# Patient Record
Sex: Female | Born: 1980 | Race: Black or African American | Hispanic: No | Marital: Single | Smoking: Current every day smoker
Health system: Southern US, Community
[De-identification: ages and names within clinical notes are randomized; demographics above are authoritative.]

## PROBLEM LIST (undated history)

## (undated) DIAGNOSIS — K8020 Calculus of gallbladder without cholecystitis without obstruction: Secondary | ICD-10-CM

---

## 2005-04-08 ENCOUNTER — Emergency Department: Payer: Self-pay | Admitting: General Practice

## 2005-08-23 ENCOUNTER — Emergency Department: Payer: Self-pay | Admitting: Unknown Physician Specialty

## 2006-05-26 ENCOUNTER — Emergency Department: Payer: Self-pay | Admitting: Emergency Medicine

## 2006-11-17 ENCOUNTER — Emergency Department: Payer: Self-pay | Admitting: Emergency Medicine

## 2008-08-21 ENCOUNTER — Emergency Department: Payer: Self-pay | Admitting: Emergency Medicine

## 2009-06-27 ENCOUNTER — Emergency Department: Payer: Self-pay | Admitting: Emergency Medicine

## 2009-09-14 ENCOUNTER — Encounter: Payer: Self-pay | Admitting: Maternal & Fetal Medicine

## 2009-09-15 ENCOUNTER — Emergency Department: Payer: Self-pay | Admitting: Unknown Physician Specialty

## 2009-11-12 ENCOUNTER — Encounter: Payer: Self-pay | Admitting: Obstetrics and Gynecology

## 2009-12-04 ENCOUNTER — Ambulatory Visit: Payer: Self-pay | Admitting: Advanced Practice Midwife

## 2009-12-07 ENCOUNTER — Encounter: Payer: Self-pay | Admitting: Maternal & Fetal Medicine

## 2009-12-18 ENCOUNTER — Ambulatory Visit: Payer: Self-pay | Admitting: Advanced Practice Midwife

## 2010-01-04 ENCOUNTER — Encounter: Payer: Self-pay | Admitting: Obstetrics & Gynecology

## 2010-01-07 ENCOUNTER — Observation Stay: Payer: Self-pay

## 2010-01-11 ENCOUNTER — Observation Stay: Payer: Self-pay | Admitting: Obstetrics and Gynecology

## 2010-01-13 ENCOUNTER — Observation Stay: Payer: Self-pay

## 2010-01-14 ENCOUNTER — Encounter: Payer: Self-pay | Admitting: Obstetrics & Gynecology

## 2010-01-18 ENCOUNTER — Observation Stay: Payer: Self-pay | Admitting: Obstetrics & Gynecology

## 2010-01-21 ENCOUNTER — Encounter: Payer: Self-pay | Admitting: Obstetrics and Gynecology

## 2010-01-21 ENCOUNTER — Observation Stay: Payer: Self-pay

## 2010-01-25 ENCOUNTER — Observation Stay: Payer: Self-pay | Admitting: Unknown Physician Specialty

## 2010-01-28 ENCOUNTER — Observation Stay: Payer: Self-pay

## 2010-01-28 ENCOUNTER — Encounter: Payer: Self-pay | Admitting: Obstetrics and Gynecology

## 2010-02-01 ENCOUNTER — Observation Stay: Payer: Self-pay

## 2010-02-04 ENCOUNTER — Observation Stay: Payer: Self-pay

## 2010-02-04 ENCOUNTER — Encounter: Payer: Self-pay | Admitting: Obstetrics and Gynecology

## 2010-02-05 ENCOUNTER — Inpatient Hospital Stay: Payer: Self-pay | Admitting: Unknown Physician Specialty

## 2012-01-27 ENCOUNTER — Emergency Department: Payer: Self-pay | Admitting: *Deleted

## 2012-01-27 LAB — CBC
HCT: 37.1 %
HGB: 12.2 g/dL
MCH: 30.1 pg
MCHC: 33 g/dL
MCV: 91 fL
Platelet: 337 10*3/uL
RBC: 4.06 X10 6/mm 3
RDW: 14.4 %
WBC: 14.7 10*3/uL — ABNORMAL HIGH

## 2012-01-27 LAB — COMPREHENSIVE METABOLIC PANEL
Albumin: 3.5 g/dL (ref 3.4–5.0)
Alkaline Phosphatase: 94 U/L (ref 50–136)
BUN: 8 mg/dL (ref 7–18)
Bilirubin,Total: 0.2 mg/dL (ref 0.2–1.0)
Calcium, Total: 8.8 mg/dL (ref 8.5–10.1)
Chloride: 107 mmol/L (ref 98–107)
Creatinine: 0.68 mg/dL (ref 0.60–1.30)
EGFR (African American): >60
Glucose: 99 mg/dL (ref 65–99)
Potassium: 3.2 mmol/L — ABNORMAL LOW (ref 3.5–5.1)
SGPT (ALT): 22 U/L (ref 12–78)
Total Protein: 7.6 g/dL (ref 6.4–8.2)

## 2012-01-27 LAB — HCG, QUANTITATIVE, PREGNANCY: Beta Hcg, Quant.: 39642 m[IU]/mL — ABNORMAL HIGH

## 2012-01-27 LAB — URINALYSIS, COMPLETE
Bilirubin,UR: NEGATIVE
Blood: NEGATIVE
Glucose,UR: NEGATIVE mg/dL
Ketone: NEGATIVE
Nitrite: NEGATIVE
Ph: 5
Protein: NEGATIVE
RBC,UR: 6 /HPF
Specific Gravity: 1.026
Squamous Epithelial: 13
WBC UR: 27 /HPF

## 2012-01-27 LAB — WET PREP, GENITAL

## 2012-01-27 LAB — PREGNANCY, URINE: Pregnancy Test, Urine: POSITIVE m[IU]/mL

## 2012-03-01 ENCOUNTER — Encounter: Payer: Self-pay | Admitting: Obstetrics and Gynecology

## 2012-04-09 ENCOUNTER — Encounter: Payer: Self-pay | Admitting: Obstetrics and Gynecology

## 2012-06-18 ENCOUNTER — Encounter: Payer: Self-pay | Admitting: Maternal & Fetal Medicine

## 2012-07-16 ENCOUNTER — Encounter: Payer: Self-pay | Admitting: Obstetrics and Gynecology

## 2012-08-13 ENCOUNTER — Encounter: Payer: Self-pay | Admitting: Maternal and Fetal Medicine

## 2012-08-20 ENCOUNTER — Observation Stay: Payer: Self-pay | Admitting: Obstetrics and Gynecology

## 2012-08-27 ENCOUNTER — Encounter: Payer: Self-pay | Admitting: Maternal & Fetal Medicine

## 2012-08-31 ENCOUNTER — Inpatient Hospital Stay: Payer: Self-pay | Admitting: Obstetrics and Gynecology

## 2012-08-31 LAB — CBC WITH DIFFERENTIAL/PLATELET
Basophil #: 0 10*3/uL (ref 0.0–0.1)
Eosinophil #: 0.1 10*3/uL (ref 0.0–0.7)
HCT: 33.3 % — ABNORMAL LOW (ref 35.0–47.0)
HGB: 11.1 g/dL — ABNORMAL LOW (ref 12.0–16.0)
Lymphocyte #: 3.4 10*3/uL (ref 1.0–3.6)
Lymphocyte %: 26.5 %
MCH: 30.4 pg (ref 26.0–34.0)
MCHC: 33.5 g/dL (ref 32.0–36.0)
MCV: 91 fL (ref 80–100)
Neutrophil #: 8.4 10*3/uL — ABNORMAL HIGH (ref 1.4–6.5)
Neutrophil %: 64.8 %
Platelet: 369 10*3/uL (ref 150–440)
RBC: 3.67 10*6/uL — ABNORMAL LOW (ref 3.80–5.20)
WBC: 12.9 10*3/uL — ABNORMAL HIGH (ref 3.6–11.0)

## 2013-10-01 ENCOUNTER — Emergency Department: Payer: Self-pay | Admitting: Emergency Medicine

## 2014-07-10 IMAGING — US US OB FOLLOW-UP - NRPT [facility]
1 series · 14 of 28 positions shown · non-contrast
Comparison: none

[Series 1: us ob follow-up - nrpt [facility] · 0.33mm/px · 14 of 64 slices shown]
[im 3/64]
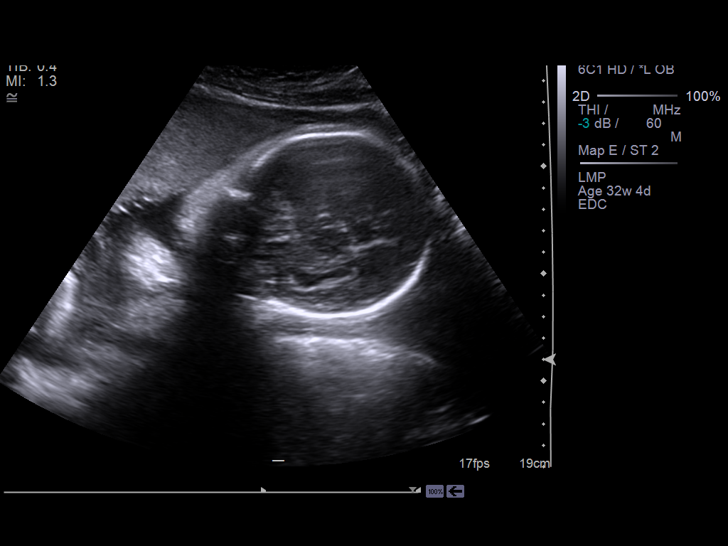
[im 8/64]
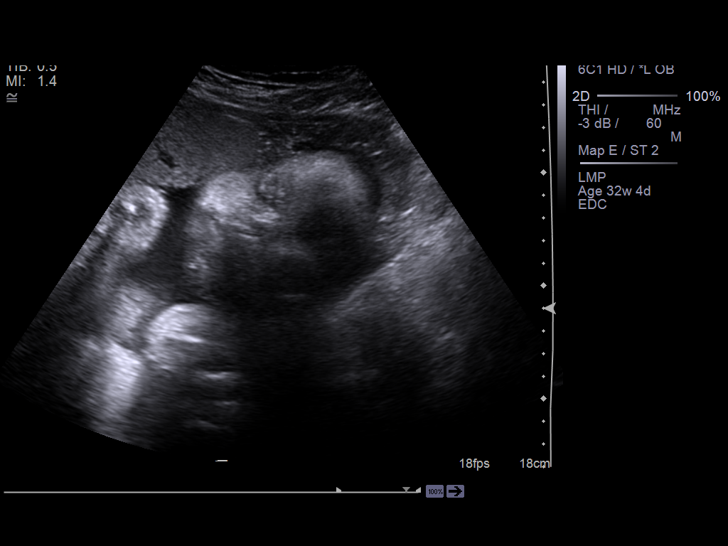
[im 12/64]
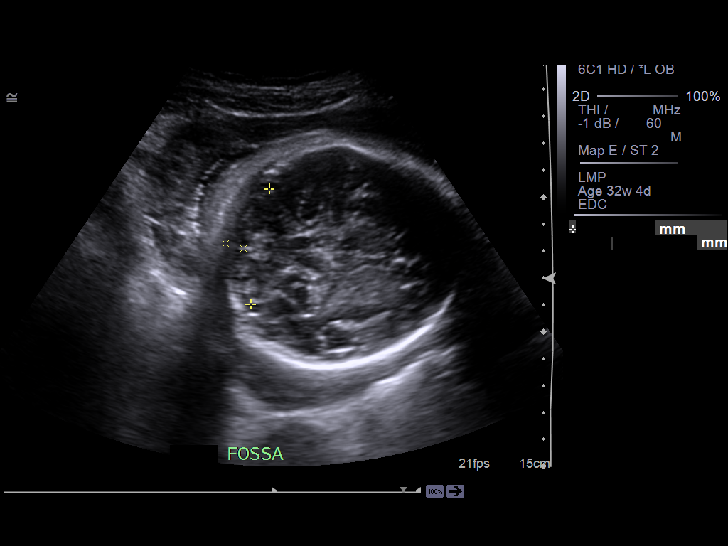
[im 17/64]
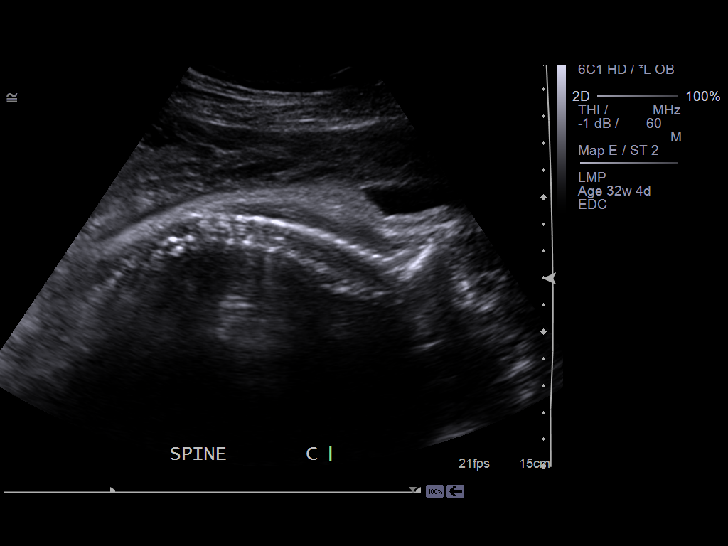
[im 22/64]
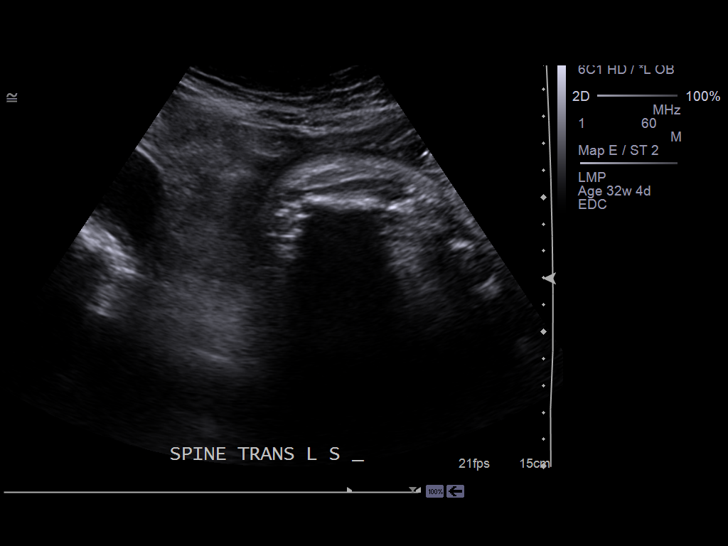
[im 26/64]
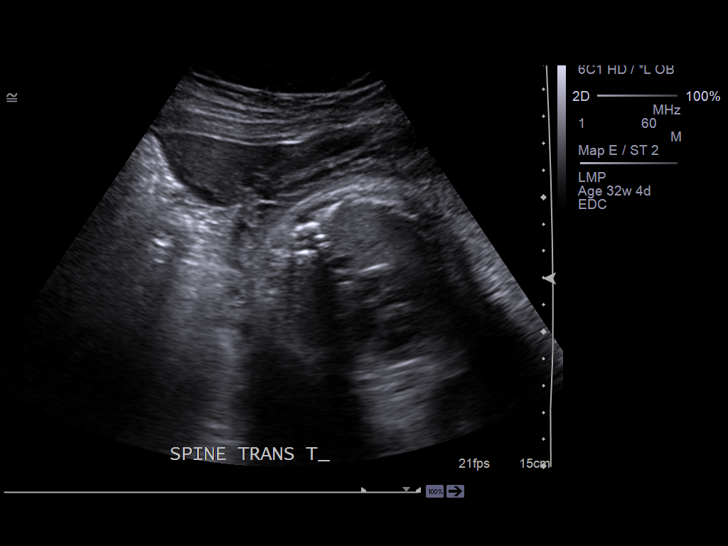
[im 31/64]
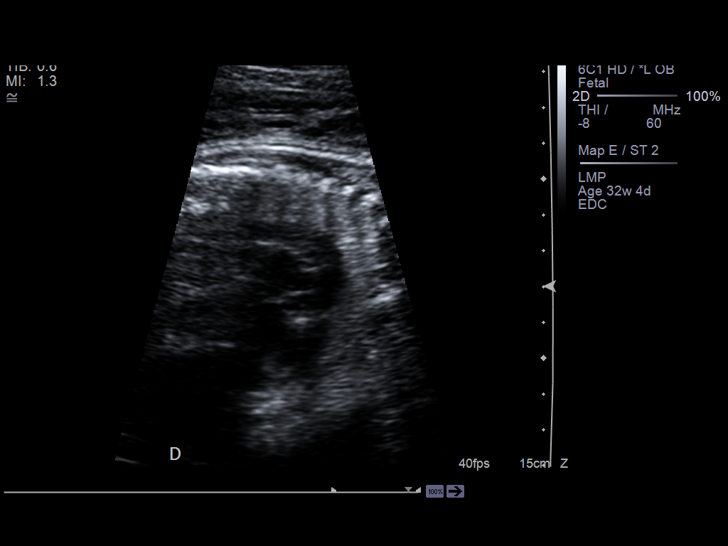
[im 36/64]
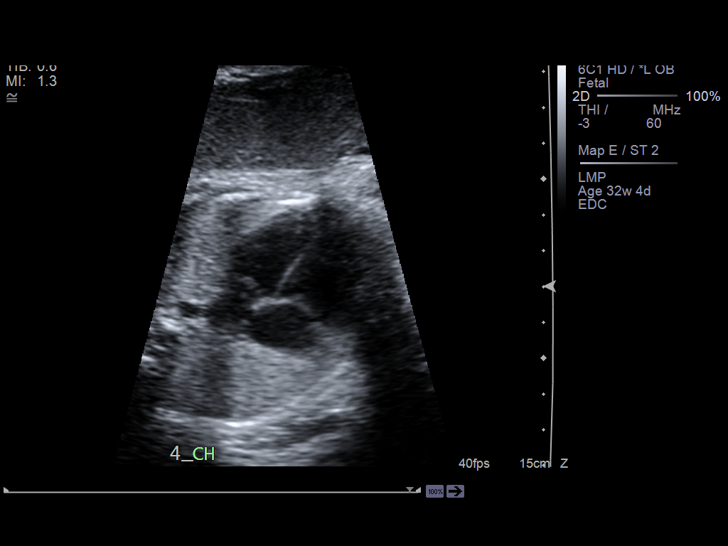
[im 40/64]
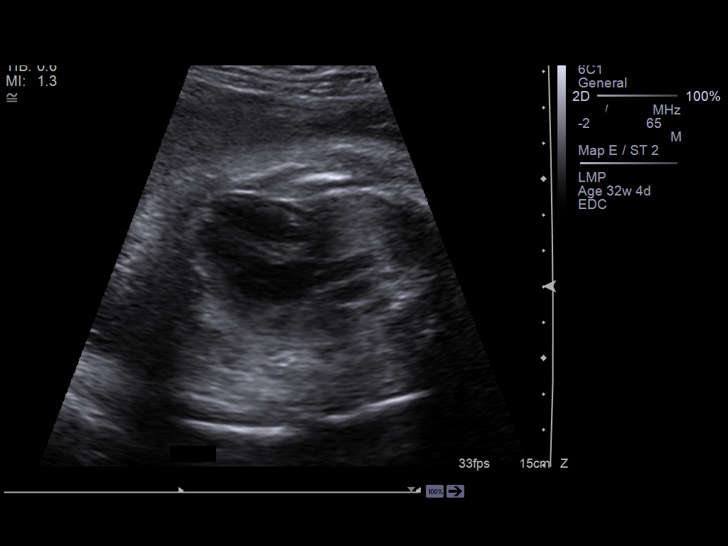
[im 45/64]
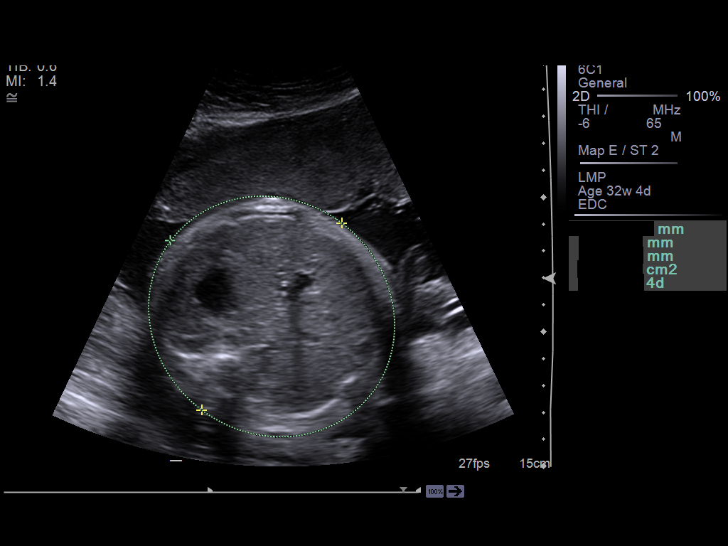
[im 50/64]
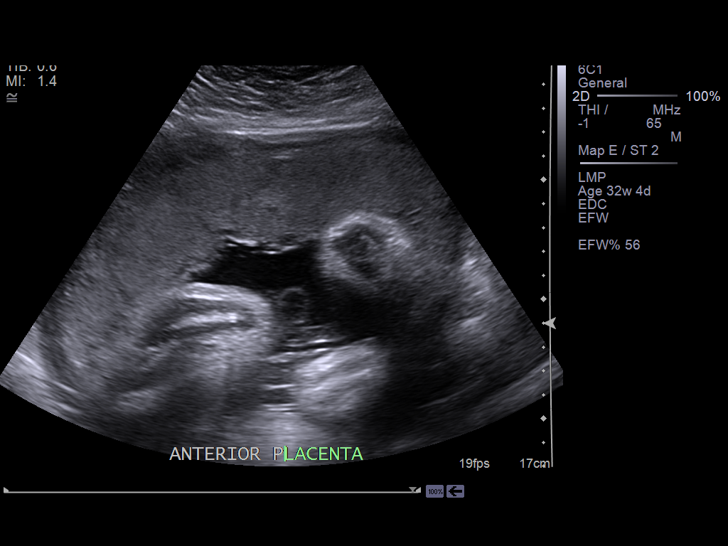
[im 54/64]
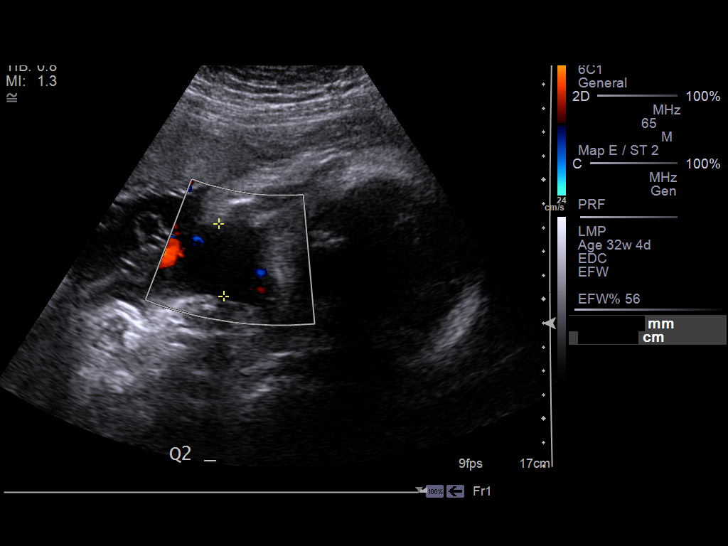
[im 59/64]
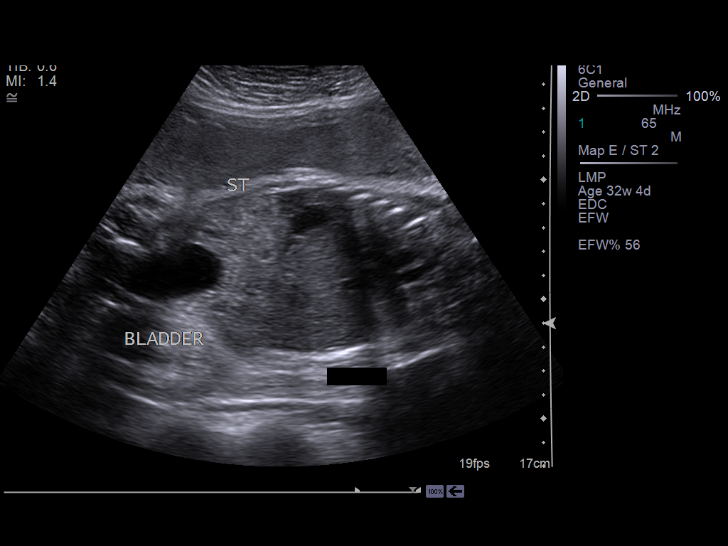
[im 64/64]
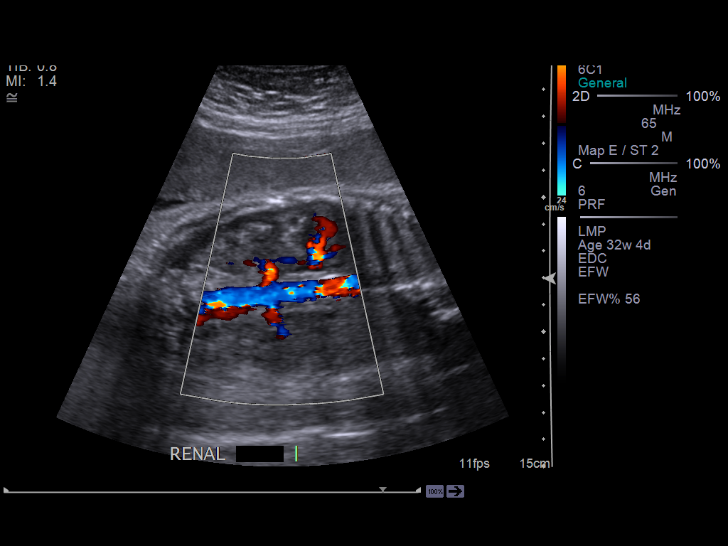

[14 of 28 positions shown; findings below may reference images not displayed]

IMAGES IMPORTED FROM THE SYNGO WORKFLOW SYSTEM
NO DICTATION FOR STUDY

## 2014-08-21 IMAGING — US ULTRAOUND OB LIMITED - NRPT [facility]
1 series · 13 of 13 positions shown · non-contrast
Comparison: none

[Series 1: ultraound ob limited - nrpt [facility] · 0.30mm/px · 13 of 13 slices shown]
[im 1/13]
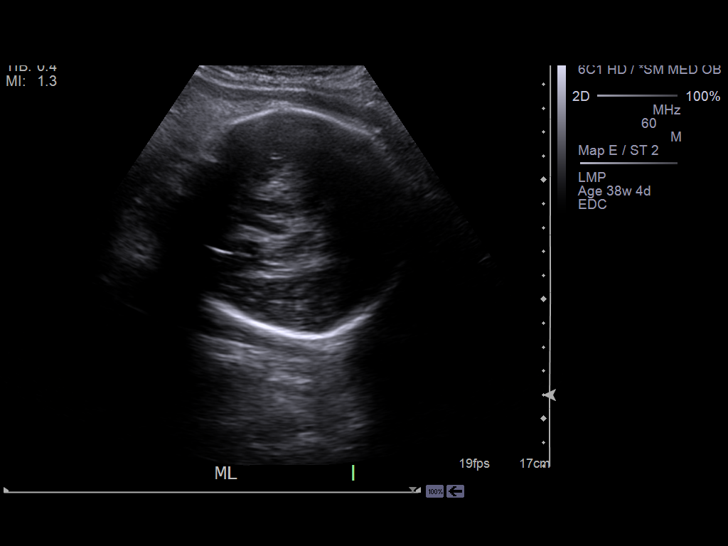
[im 2/13]
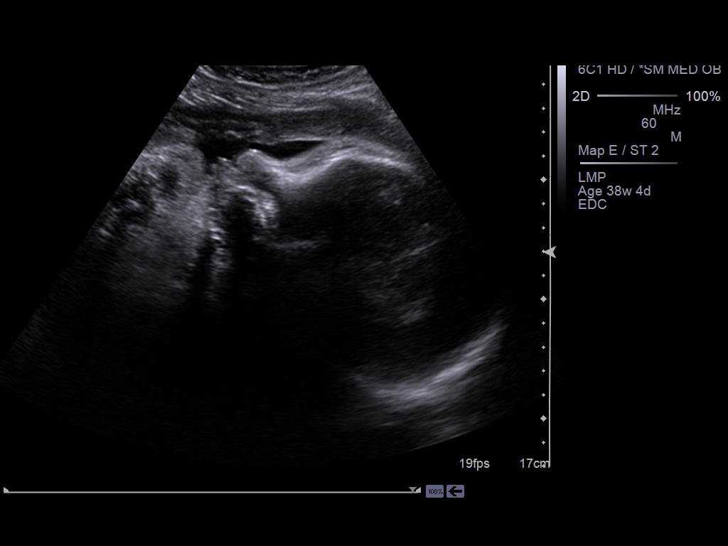
[im 3/13]
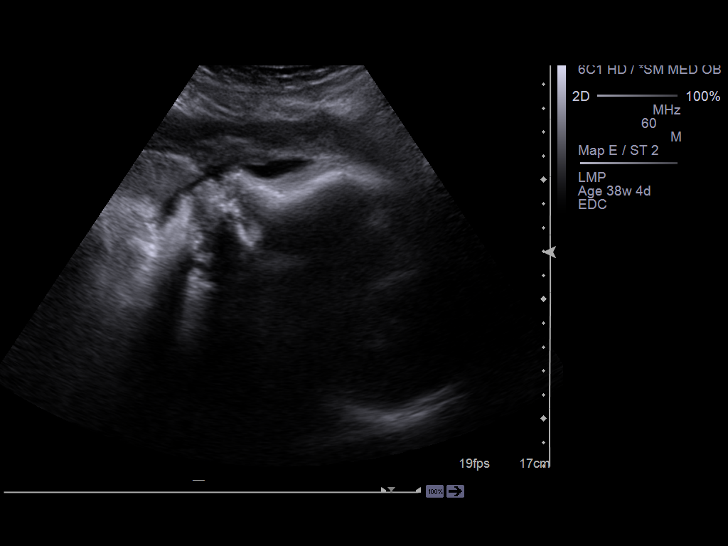
[im 4/13]
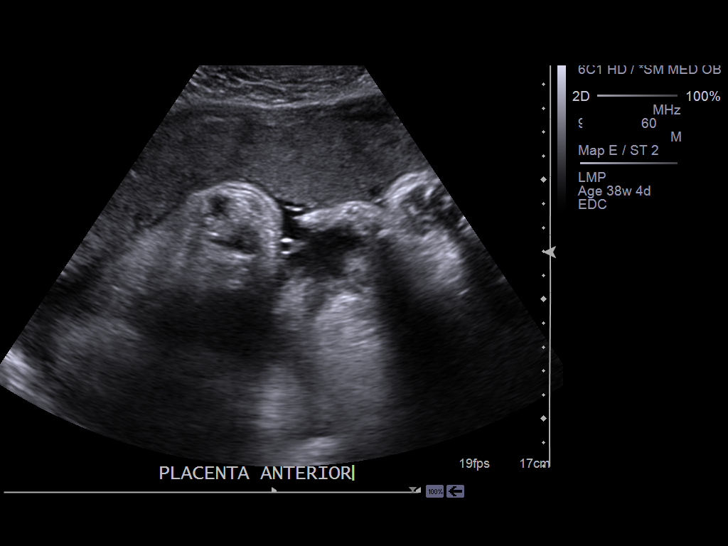
[im 5/13]
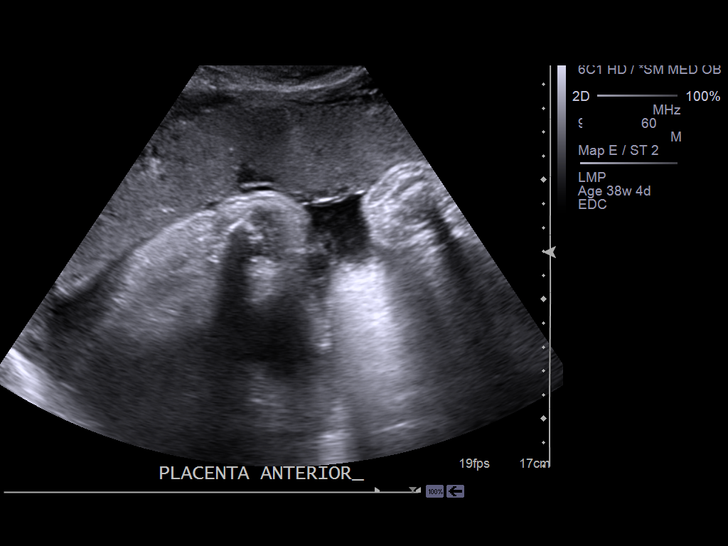
[im 6/13]
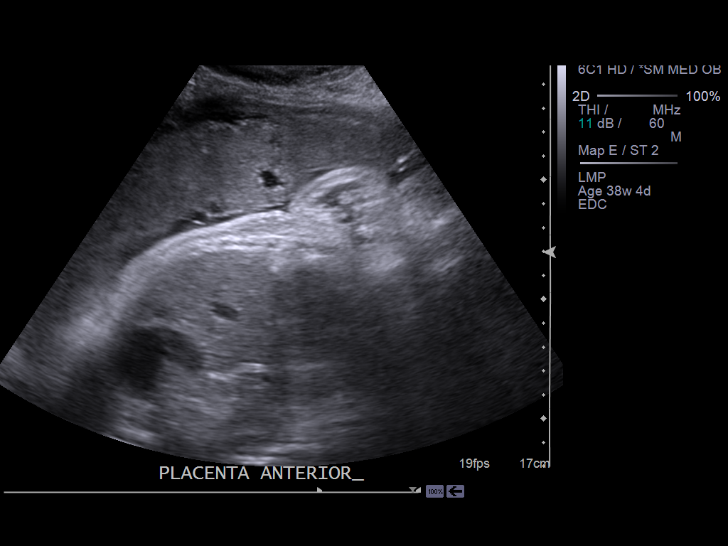
[im 7/13]
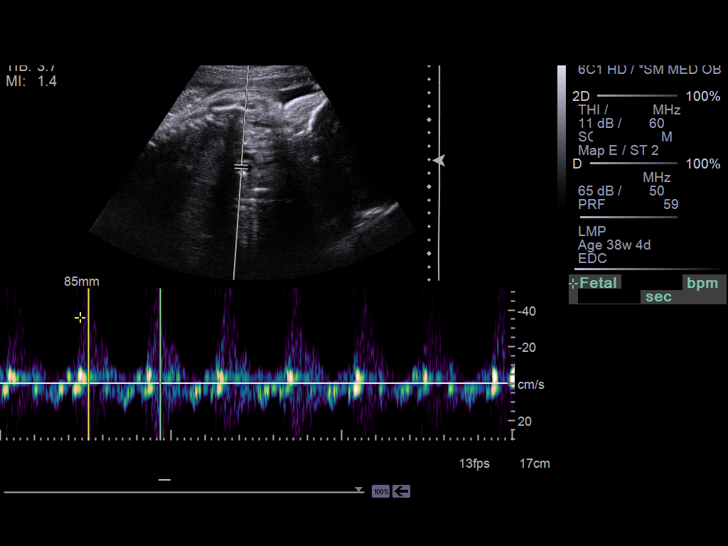
[im 8/13]
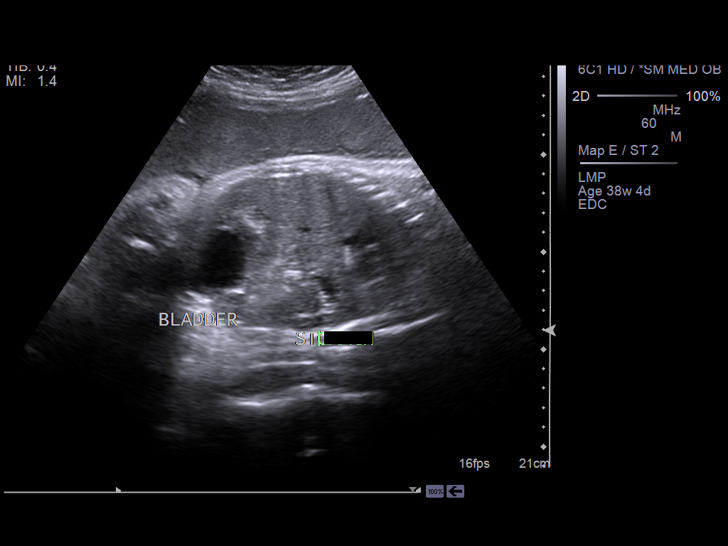
[im 9/13]
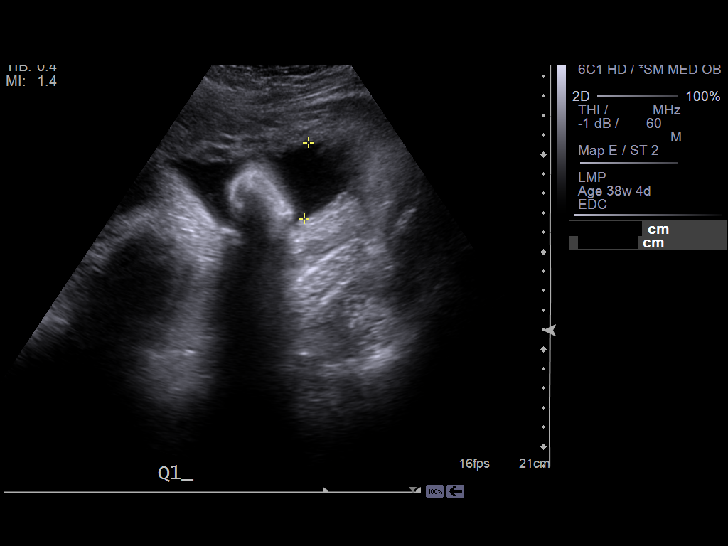
[im 10/13]
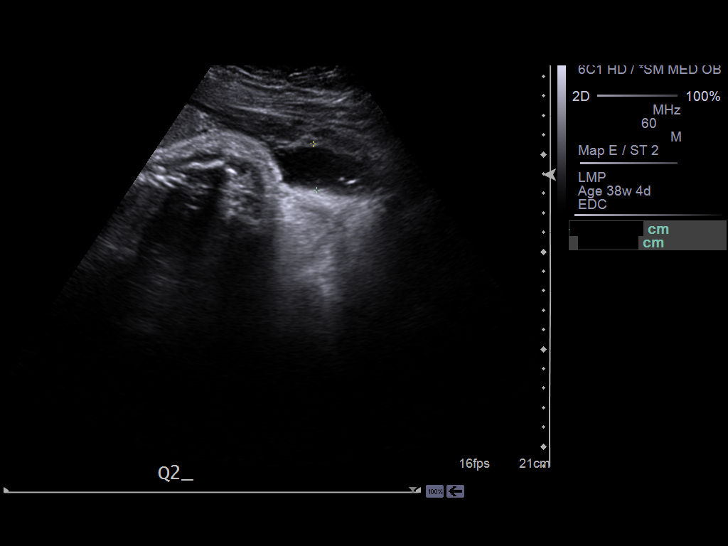
[im 11/13]
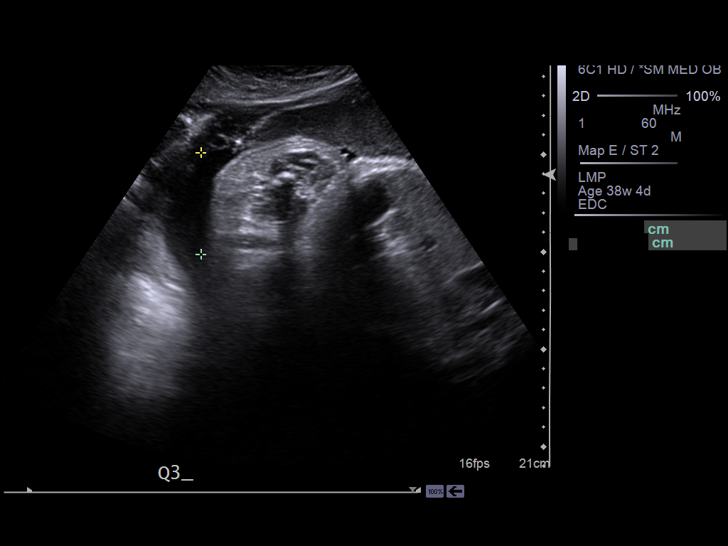
[im 12/13]
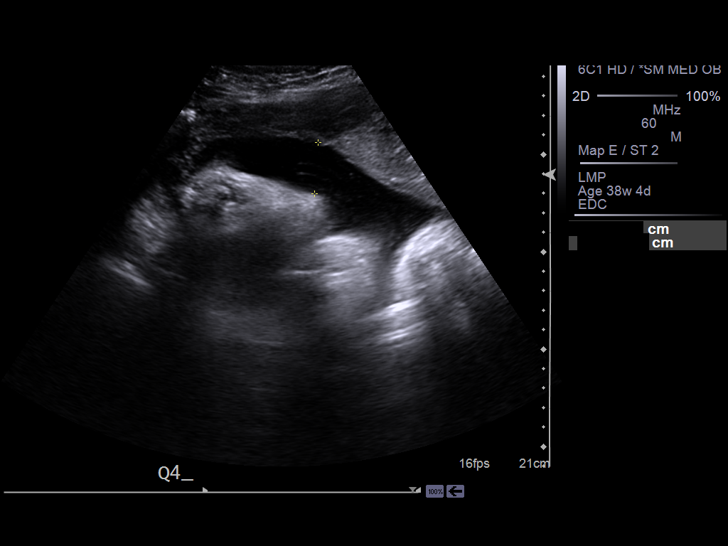
[im 13/13]
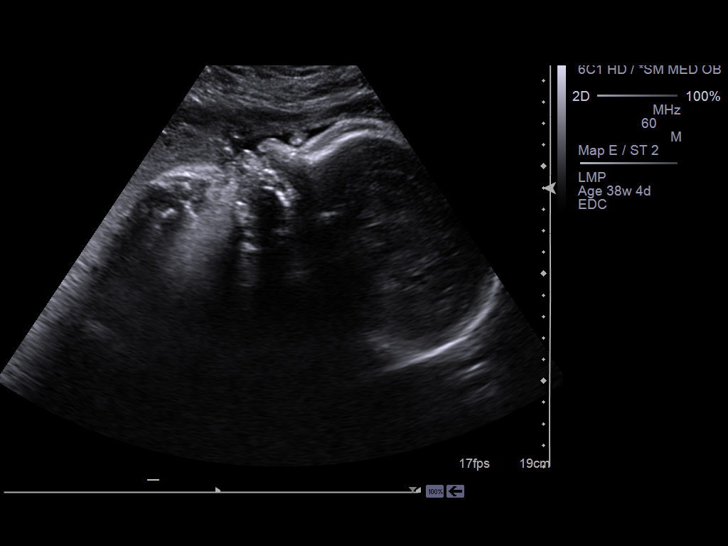

[13 of 13 positions shown; findings below may reference images not displayed]

IMAGES IMPORTED FROM THE SYNGO WORKFLOW SYSTEM
NO DICTATION FOR STUDY

## 2014-10-07 NOTE — Consult Note (Signed)
Referral Information:   Reason for Referral History of gestational diabetes and borderline hypertension, currently pregnant    Referring Physician [facility] Department    Prenatal Hx Brianna Murphy is a 34 year-old G2 P1001 at 5 0/7 weeks ([facility] 09/08/12) who presents for recommendations in her current pregnancy.  We saw Brianna Murphy in her prior pregnancy for borderline hypertension. Her BP's remained in the normal range throughout the entire pregnancy. She never required treatment and never developed preeclampsia.  She states that her blood pressure increased once she was discharged postpartum and was on medication for approximately 6 months but then discontinued this as her pressures were normal. She is unsure of the name of the medication.  Also during her prior pregnnacy she developed gestational diabetes. She states that this required low-dose glyburide (2.5 mg every evening). She was induced at 39 weeks and had an uncomplicated spontaneous vaginal delivery of a 6 pound 14 ounce female.  She states that she was checked for type II diabetes after delivery and this workup was negative.   Currently she is without complaints.    Past Obstetrical Hx G2 P1001 -2011: spontaneous vaginal delivery of 6 pound 14 ounce female. Labor induction at 39 weeks for GDM on low-dose glyburide. No complications. No preeclampsia.   Home Medications: Medication Instructions Status  Prenatal Multivitamins oral tablet 1 tab(s) orally once a day Active   Allergies:   Latex: Rash  Vital Signs/Notes:  Nursing Vital Signs: **Vital Signs.:   12-Sep-13 10:13   Pulse Pulse 87   Systolic BP Systolic BP 133   Diastolic BP (mmHg) Diastolic BP (mmHg) 76     Additional Lab/Radiology Notes Prenatal labs ([facility], 01/27/12): O positive, antibody screen negative, Hep B neg, Sickle Screen neg, RPR non-reactive, HIV pending, GC/Chlamydia neg, Hct 37.8, Platlet count 347, Hemoglocin A1C 6.0%, AST 10, Uric acid 3.9, Tox  screen + THC. Early glucola 133  BP's at PN visits: 136/90, 122/84, 118/72, 140/84, 130/78 Urine protein:  Trace   Impression/Recommendations:   Impression 34 year-old G2 P1001 at 13 weeks with borderline elevated blood pressures and history of gestational diabetes that required therapy in a prior pregnnacy.  Her hemoglobin A1C is 6.0% and her early glucola is 133. She is also obese.    Recommendations 1. We discussed risks of obesity in pregnancy to include hypertension, diabetes, cesarean delivery and wound complications.  She has been referred to [facility] by the [facility].  A referral to [facility] will likely be beneficial.  We also discussed appropriate weight gain and need for exercise in pregnancy.  2. Borderline hypertension.  Brianna Murphy's blood pressures at her prenatal visits have been borderline elevated. Currently she does not require therapy but she is a risk for worsening hypertension and complications associated with hypertension in pregnancy.  Should her blood pressure increase to greater than 140/90 on two different measurements, then we recommend initiating labetalol (100 mg every 12 hours) and then increasing as needed to keep her blood pressure less than 140/90 (max dose is 2400 mg daily).  If she requires initation of antihypertensive therapy, we would be happy to see her back again to make further pregnancy recommendations. A baseline 24 hour urine for protein and creatinine would be beneficial as well as an EKG.  3. History of GDM requiring therapy.  Her hemoglobin A1C is borderline (6.0%) as is her early glucola (130). It is not clear if she has type II diabetes but she is certainly at risk (obese, history of GDM requiring  therapy).  I feel that it would be beneficial for Brianna Murphy to be referred to [facility] to review appropriate diet.  Furthermore, it may be beneficial for her to check her Fasting and two hour postprandial values for the next 2-3 weeks. If these are normal, then repeat  her glucola at approximately 28 weeks. If these are abnormal, we would be happy to see her back, to follow her sugars and make recommendations for potential medical therapy. We discussed complications associated with poorly controlled diabetes.     Comments ...continued from above....  4. Further recommendations: -Offer aneuploidy and ONTD screening -She is scheduled for a detailed anatomy ultrasound with us at 18 weeks -Check growth ultrasound at 28 weeks -Should she develop hypertension that requires therapy or gestational diabetes, please send her back so we can make further pregnnacy recommendations. -We also discussed risks of smoking and marijuana use in pregnancy. She states she understands these risks. She is down to 1 cigaretter per day and is no longer using marijuana.     Total Time Spent with Patient 45 minutes    >50% of visit spent in couseling/coordination of care yes    Office Use Only [address]  Level 3 (40min) NEW office consult detailed   Coding Description: MATERNAL CONDITIONS/HISTORY INDICATION(S).   OTHER: history of GDM. Borderline hypertension.  Electronic Signatures: Konstantin Lehnen, Brianna Murphy (MD)  (Signed 12-Sep-13 11:18)  Authored: Referral, Home Medications, Allergies, Vital Signs/Notes, Exam, Lab/Radiology Notes, Impression, Other Comments, Billing, Coding Description   Last Updated: 12-Sep-13 11:18 by Brianna Murphy, Brianna Murphy (MD)

## 2014-10-28 NOTE — H&P (Signed)
L&D Evaluation:  History Expanded:  HPI 34 yo G2P1 AA F at 7838 6/7 weeks w spontaneous rupture of membranes at 2315 last night, w steady increase in contractions iver the last couple of hours.  No vaginal bleeding.  Good FM. Prenatal Care at [facility], cHTN but well controlled without medicine this pregnancy.  Risk factors included obesity and smoking history.  Prior Normal Spontaneous Vaginal Delivery, induced for Pregnancy Induced Hypertension and GDM that time.   Gravida 2   Term 1   Blood Type (Maternal) O positive   Group B Strep Results Maternal (Result >5wks must be treated as unknown) negative   Maternal Varicella Immune   Rubella Results (Maternal) immune   Maternal T-Dap Immune   [facility]   Presents with leaking fluid   Patient's Medical History Hypertension   Patient's Surgical History none   Medications Pre Natal Vitamins   Allergies NKDA, Latex   Social History tobacco   Family History Non-Contributory   ROS:  ROS All systems were reviewed.  HEENT, CNS, GI, GU, Respiratory, CV, Renal and Musculoskeletal systems were found to be normal.   Exam:  Vital Signs stable   General no apparent distress   Mental Status clear   Chest clear   Heart normal sinus rhythm, no murmur/gallop/rubs   Abdomen gravid, tender with contractions   Estimated Fetal Weight Average for gestational age   Back no CVAT   Edema no edema   Pelvic no external lesions, 7/90/0   Mebranes Ruptured   Description clear   FHT normal rate with no decels, 130s, reactive   Ucx irregular   Skin dry   Impression:  Impression active labor   Plan:  Plan EFM/NST, monitor contractions and for cervical change, fluids   Electronic Signatures: Letitia LibraHarris, Robert Paul (MD)  (Signed 14-Mar-14 07:55)  Authored: L&D Evaluation   Last Updated: 14-Mar-14 07:55 by Letitia LibraHarris, Robert Paul (MD)

## 2016-05-07 ENCOUNTER — Emergency Department
Admission: EM | Admit: 2016-05-07 | Discharge: 2016-05-07 | Disposition: A | Discharge status: Home / Self Care | Payer: Medicaid Other | Attending: Emergency Medicine | Admitting: Emergency Medicine

## 2016-05-07 ENCOUNTER — Encounter: Payer: Self-pay | Admitting: Emergency Medicine

## 2016-05-07 DIAGNOSIS — Y999 Unspecified external cause status: Secondary | ICD-10-CM | POA: Insufficient documentation

## 2016-05-07 DIAGNOSIS — X58XXXA Exposure to other specified factors, initial encounter: Secondary | ICD-10-CM | POA: Insufficient documentation

## 2016-05-07 DIAGNOSIS — S025XXA Fracture of tooth (traumatic), initial encounter for closed fracture: Secondary | ICD-10-CM | POA: Diagnosis not present

## 2016-05-07 DIAGNOSIS — Y939 Activity, unspecified: Secondary | ICD-10-CM | POA: Insufficient documentation

## 2016-05-07 DIAGNOSIS — Y929 Unspecified place or not applicable: Secondary | ICD-10-CM | POA: Insufficient documentation

## 2016-05-07 DIAGNOSIS — K0889 Other specified disorders of teeth and supporting structures: Secondary | ICD-10-CM

## 2016-05-07 DIAGNOSIS — F17210 Nicotine dependence, cigarettes, uncomplicated: Secondary | ICD-10-CM | POA: Insufficient documentation

## 2016-05-07 MED ORDER — AMOXICILLIN 500 MG PO CAPS: 500.0000 mg | ORAL_CAPSULE | Freq: Three times a day (TID) | ORAL | 0 refills | Status: DC

## 2016-05-07 MED ORDER — IBUPROFEN 800 MG PO TABS: 800.0000 mg | ORAL_TABLET | Freq: Three times a day (TID) | ORAL | 0 refills | Status: DC | PRN

## 2016-05-07 NOTE — ED Notes (Signed)
Pt verbalized understanding of discharge instructions. NAD at this time. 

## 2016-05-07 NOTE — Discharge Instructions (Signed)
Follow-up list of dental clinics provided; °OPTIONS FOR DENTAL FOLLOW UP CARE ° °[place] Department of Health and Human Services - Local Safety Net Dental Clinics °http://www.ncdhhs.gov/dph/oralhealth/services/safetynetclinics.htm °  °[facility] ([phone]) ° °[facility] [place] ([phone]) ° °[facility] ([phone] ext [phone]) ° °[facility] ([phone]) ° °[facility] ([phone]) °This clinic caters to the indigent population and is on a lottery system. °Location: °[facility], [address], [place] °Clinic Hours: °Wednesdays from 6pm - 9pm, patients seen by a lottery system. °For dates, call or go to www.med.[facility].edu/[facility]/patients/Dental-[facility] °Services: °Cleanings, fillings and simple extractions. °Payment Options: °DENTAL WORK IS FREE OF CHARGE. Bring proof of income or support. °Best way to get seen: °Arrive at 5:15 pm - this is a lottery, NOT first come/first serve, so arriving earlier will not increase your chances of being seen. °  °  °[facility] °[phone] °Select option 1 for emergencies °  °Location: °[facility], [address], [place] °Clinic Hours: °No walk-ins accepted - call the day before to schedule an appointment. °Check in times are 9:30 am and 1:30 pm. °Services: °Simple extractions, temporary fillings, pulpectomy/pulp debridement, uncomplicated abscess drainage. °Payment Options: °PAYMENT IS DUE AT THE TIME OF SERVICE.  Fee is usually $100-200, additional surgical procedures (e.g. abscess drainage) may be extra. °Cash, checks, Visa/MasterCard accepted.  Can file Medicaid if patient is covered for dental - patient should call case worker to check. °No discount for [facility] patients. °Best way to get seen: °MUST call the day before and get onto the schedule. Can usually be seen the next 1-2 days. No walk-ins accepted. °  °  °[facility] °[phone] °  °Location: °[facility], [address], [place] °Clinic Hours: °M, W, Th, F 8am or 1:30pm, Tues 9a or 1:30 - first come/first served. °Services: °Simple extractions, temporary fillings, uncomplicated abscess drainage.  You do not need to be an [place] resident. °Payment Options: °PAYMENT IS DUE AT THE TIME OF SERVICE. °Dental insurance, otherwise sliding scale - bring proof of income or support. °Depending on income and treatment needed, cost is usually $50-200. °Best way to get seen: °Arrive early as it is first come/first served. °  °  °[facility] °[phone] °  °Location: °[address] °Clinic Hours: °Mon-Thu 8a-5p °Services: °Most basic dental services including extractions and fillings. °Payment Options: °PAYMENT IS DUE AT THE TIME OF SERVICE. °Sliding scale, up to 50% off - bring proof if income or support. °Medicaid with dental option accepted. °Best way to get seen: °Call to schedule an appointment, can usually be seen within 2 weeks OR they will try to see walk-ins - show up at 8a or 2p (you may have to wait). °  °  °[facility] °[phone] °[place] RESIDENTS ONLY °  °Location: °[facility], [address], [place], [place] [address] °Clinic Hours: By appointment only. °Monday - Thursday 8am-5pm, Friday 8am-12pm °Services: Cleanings, fillings, extractions. °Payment Options: °PAYMENT IS DUE AT THE TIME OF SERVICE. °Cash, Visa or MasterCard. Sliding scale - $30 minimum per service. °Best way to get seen: °Come in to office, complete packet and make an appointment - need proof of income °or support monies for each household member and proof of [place] residence. °Usually takes about a month to get in. °  °  °[facility] °[phone] °  °  Location: [address], [place] Clinic Hours: Walk-in Urgent Care Dental Services  are offered Monday-Friday mornings only. The numbers of emergencies accepted daily is limited to the number of providers available. Maximum 15 - Mondays, Wednesdays & Thursdays Maximum 10 - Tuesdays & Fridays Services: You do not need to be a [facility] resident to be seen for a dental emergency. Emergencies are defined as pain, swelling, abnormal bleeding, or dental trauma. Walkins will receive x-rays if needed. NOTE: Dental cleaning is not an emergency. Payment Options: PAYMENT IS DUE AT THE TIME OF SERVICE. Minimum co-pay is $40.00 for uninsured patients. Minimum co-pay is $3.00 for Medicaid with dental coverage. Dental Insurance is accepted and must be presented at time of visit. Medicare does not cover dental. Forms of payment: Cash, credit card, checks. Best way to get seen: If not previously registered with the clinic, walk-in dental registration begins at 7:15 am and is on a first come/first serve basis. If previously registered with the clinic, call to make an appointment.     [facility] [place] ONLY   Location: [address], [place], [place] Clinic Hours: Mon-Thu 10a-2p Services: Extractions only! Payment Options: FREE (donations accepted) - bring proof of income or support Best way to get seen: Call and schedule an appointment OR come at 8am on the 1st Monday of every month (except for holidays) when it is first come/first served.     [facility] [phone]   Location: [place], [place] Clinic Hours: Friday mornings Services, Payment Options, Best way to get seen: Call for info

## 2016-05-07 NOTE — ED Provider Notes (Signed)
[facility] Emergency Department Provider Note   ____________________________________________   First MD Initiated Contact with Patient 05/07/16 1251     (approximate)  I have reviewed the triage vital signs and the nursing notes.   HISTORY  Chief Complaint Dental Pain    HPI Brianna Murphy is a 35 y.o. female patient complaining of left lower dental pain since yesterday. Patient stated upon his waking this morning she knows some mild swelling. Patient rates the pain as a 10. Patient described a pain as "achy". Patient states she has a cracked tooth in the left lower molar. No palliative measures for this complaint.   No past medical history on file.  There are no active problems to display for this patient.   History reviewed. No pertinent surgical history.  Prior to Admission medications   Medication Sig Start Date End Date Taking? Authorizing Provider  amoxicillin (AMOXIL) 500 MG capsule Take 1 capsule (500 mg total) by mouth 3 (three) times daily. 05/07/16   Joni Reiningonald K Anael Rosch, PA-C  ibuprofen (ADVIL,MOTRIN) 800 MG tablet Take 1 tablet (800 mg total) by mouth every 8 (eight) hours as needed for moderate pain. 05/07/16   Joni Reiningonald K Gilmer Kaminsky, PA-C    Allergies Latex  No family history on file.  Social History Social History  Substance Use Topics  . Smoking status: Current Every Day Smoker    Packs/day: 0.50    Types: Cigarettes  . Smokeless tobacco: Not on file  . Alcohol use No    Review of Systems Constitutional: No fever/chills Eyes: No visual changes. ENT: No sore throat. Dental pain Cardiovascular: Denies chest pain. Respiratory: Denies shortness of breath. Gastrointestinal: No abdominal pain.  No nausea, no vomiting.  No diarrhea.  No constipation. Genitourinary: Negative for dysuria. Musculoskeletal: Negative for back pain. Skin: Negative for rash. Neurological: Negative for headaches, focal weakness or  numbness.    ____________________________________________   PHYSICAL EXAM:  VITAL SIGNS: ED Triage Vitals  Enc Vitals Group     BP 05/07/16 1235 (!) 154/93     Pulse Rate 05/07/16 1235 82     Resp 05/07/16 1235 18     Temp 05/07/16 1235 98.6 F (37 C)     Temp Source 05/07/16 1235 Oral     SpO2 05/07/16 1235 95 %     Weight 05/07/16 1236 256 lb (116.1 kg)     Height 05/07/16 1236 5\' 10"  (1.778 m)     Head Circumference --      Peak Flow --      Pain Score 05/07/16 1236 4     Pain Loc --      Pain Edu? --      Excl. in GC? --    Constitutional: Alert and oriented. Well appearing and in no acute distress. Eyes: Conjunctivae are normal. PERRL. EOMI. Head: Atraumatic. Nose: No congestion/rhinnorhea. Mouth/Throat: Mucous membranes are moist.  Oropharynx non-erythematous.Fractured tooth #19 with mild gingival edema. Neck: No stridor.  No cervical spine tenderness to palpation. Hematological/Lymphatic/Immunilogical: No cervical lymphadenopathy. Cardiovascular: Normal rate, regular rhythm. Grossly normal heart sounds.  Good peripheral circulation. Respiratory: Normal respiratory effort.  No retractions. Lungs CTAB. Gastrointestinal: Soft and nontender. No distention. No abdominal bruits. No CVA tenderness. Musculoskeletal: No lower extremity tenderness nor edema.  No joint effusions. Neurologic:  Normal speech and language. No gross focal neurologic deficits are appreciated. No gait instability. Skin:  Skin is warm, dry and intact. No rash noted. Psychiatric: Mood and affect are normal. Speech  and behavior are normal.  ____________________________________________   LABS (all labs ordered are listed, but only abnormal results are displayed)  Labs Reviewed - No data to display ____________________________________________  EKG   ____________________________________________  RADIOLOGY   ____________________________________________   PROCEDURES  Procedure(s)  performed: None  Procedures  Critical Care performed: No  ____________________________________________   INITIAL IMPRESSION / ASSESSMENT AND PLAN / ED COURSE  Pertinent labs & imaging results that were available during my care of the patient were reviewed by me and considered in my medical decision making (see chart for details).  Dental pain secondary to fractured tooth. Patient given discharge care instructions. Patient given a list of dental clinic for follow-up care. Patient given a prescription for Amoxil and ibuprofen.  Clinical Course      ____________________________________________   FINAL CLINICAL IMPRESSION(S) / ED DIAGNOSES  Final diagnoses:  Closed fracture of tooth, initial encounter  Pain, dental      NEW MEDICATIONS STARTED DURING THIS VISIT:  New Prescriptions   AMOXICILLIN (AMOXIL) 500 MG CAPSULE    Take 1 capsule (500 mg total) by mouth 3 (three) times daily.   IBUPROFEN (ADVIL,MOTRIN) 800 MG TABLET    Take 1 tablet (800 mg total) by mouth every 8 (eight) hours as needed for moderate pain.     Note:  This document was prepared using Dragon voice recognition software and may include unintentional dictation errors.    Joni ReiningRonald K Rohaan Durnil, PA-C 05/07/16 1306    Myrna Blazeravid Matthew Schaevitz, MD 05/07/16 [id number]

## 2016-05-07 NOTE — ED Triage Notes (Signed)
L lower dental pain since yesterday, today notes swelling.

## 2017-09-14 ENCOUNTER — Other Ambulatory Visit: Payer: Self-pay

## 2017-09-14 ENCOUNTER — Encounter: Payer: Self-pay | Admitting: Emergency Medicine

## 2017-09-14 DIAGNOSIS — F17210 Nicotine dependence, cigarettes, uncomplicated: Secondary | ICD-10-CM | POA: Insufficient documentation

## 2017-09-14 DIAGNOSIS — K8020 Calculus of gallbladder without cholecystitis without obstruction: Secondary | ICD-10-CM | POA: Insufficient documentation

## 2017-09-14 DIAGNOSIS — Z91040 Latex allergy status: Secondary | ICD-10-CM | POA: Insufficient documentation

## 2017-09-14 NOTE — ED Triage Notes (Signed)
Patient ambulatory to triage with steady gait, without difficulty or distress noted; pt reports lower abd pain intermittently x 2-3wks with no accomp symptoms; pt is also requesting to have her implanon removed because its overdue to be removed

## 2017-09-15 ENCOUNTER — Emergency Department
Admission: EM | Admit: 2017-09-15 | Discharge: 2017-09-15 | Disposition: A | Discharge status: Home / Self Care | Payer: Self-pay | Attending: Emergency Medicine | Admitting: Emergency Medicine

## 2017-09-15 ENCOUNTER — Emergency Department: Payer: Self-pay

## 2017-09-15 DIAGNOSIS — R52 Pain, unspecified: Secondary | ICD-10-CM

## 2017-09-15 DIAGNOSIS — K8050 Calculus of bile duct without cholangitis or cholecystitis without obstruction: Secondary | ICD-10-CM

## 2017-09-15 LAB — CBC
HCT: 38.2 % (ref 35.0–47.0)
Hemoglobin: 13 g/dL (ref 12.0–16.0)
MCH: 31.3 pg (ref 26.0–34.0)
MCHC: 34.1 g/dL (ref 32.0–36.0)
MCV: 91.7 fL (ref 80.0–100.0)
Platelets: 311 K/uL (ref 150–440)
RBC: 4.16 MIL/uL (ref 3.80–5.20)
RDW: 14.4 % (ref 11.5–14.5)
WBC: 10.6 K/uL (ref 3.6–11.0)

## 2017-09-15 LAB — URINALYSIS, COMPLETE (UACMP) WITH MICROSCOPIC
Bacteria, UA: NONE SEEN
Bilirubin Urine: NEGATIVE
Glucose, UA: NEGATIVE mg/dL
Ketones, ur: NEGATIVE mg/dL
Leukocytes, UA: NEGATIVE
Nitrite: NEGATIVE
Protein, ur: NEGATIVE mg/dL
Specific Gravity, Urine: 1.006 (ref 1.005–1.030)
pH: 7 (ref 5.0–8.0)

## 2017-09-15 LAB — COMPREHENSIVE METABOLIC PANEL
ALK PHOS: 79 U/L (ref 38–126)
ALT: 19 U/L (ref 14–54)
AST: 18 U/L (ref 15–41)
Albumin: 3.5 g/dL (ref 3.5–5.0)
Anion gap: 9 (ref 5–15)
BUN: 9 mg/dL (ref 6–20)
CHLORIDE: 105 mmol/L (ref 101–111)
CO2: 23 mmol/L (ref 22–32)
Calcium: 8.7 mg/dL — ABNORMAL LOW (ref 8.9–10.3)
Creatinine, Ser: 0.63 mg/dL (ref 0.44–1.00)
GFR calc Af Amer: >60 mL/min (ref >60)
GFR calc non Af Amer: >60 mL/min (ref >60)
GLUCOSE: 96 mg/dL (ref 65–99)
Potassium: 3.4 mmol/L — ABNORMAL LOW (ref 3.5–5.1)
SODIUM: 137 mmol/L (ref 135–145)
Total Bilirubin: 0.4 mg/dL (ref 0.3–1.2)
Total Protein: 7.2 g/dL (ref 6.5–8.1)

## 2017-09-15 LAB — LIPASE, BLOOD: LIPASE: 32 U/L (ref 11–51)

## 2017-09-15 LAB — POCT PREGNANCY, URINE: Preg Test, Ur: NEGATIVE

## 2017-09-15 MED ORDER — IBUPROFEN 600 MG PO TABS: 600.0000 mg | ORAL_TABLET | Freq: Three times a day (TID) | ORAL | 0 refills | Status: DC | PRN

## 2017-09-15 MED ORDER — ONDANSETRON 4 MG PO TBDP
4.0000 mg | ORAL_TABLET | Freq: Once | ORAL | Status: AC
  Administered 2017-09-15: 4 mg via ORAL
  Filled 2017-09-15: qty 1

## 2017-09-15 MED ORDER — IBUPROFEN 600 MG PO TABS
600.0000 mg | ORAL_TABLET | Freq: Once | ORAL | Status: AC
  Administered 2017-09-15: 600 mg via ORAL
  Filled 2017-09-15: qty 1

## 2017-09-15 MED ORDER — DICYCLOMINE HCL 10 MG PO CAPS
20.0000 mg | ORAL_CAPSULE | Freq: Once | ORAL | Status: AC
  Administered 2017-09-15: 20 mg via ORAL
  Filled 2017-09-15: qty 2

## 2017-09-15 MED ORDER — ONDANSETRON 4 MG PO TBDP: 4.0000 mg | ORAL_TABLET | Freq: Three times a day (TID) | ORAL | 0 refills | Status: DC | PRN

## 2017-09-15 NOTE — ED Notes (Signed)
Pt states that for past three weeks (on and off) her stomach (across the middle) has been hurting her. She says it felt like labor pains and it will hurt her so bad all she can do is lay down on it to make it feel better. Pt is alert and oriented x 4.

## 2017-09-15 NOTE — Discharge Instructions (Signed)
Please take your pain and nausea medication as needed for severe symptoms and make an appointment to follow-up with the general surgeon this coming week for reevaluation.  Return to the emergency department sooner for any concerns such as fevers, chills, worsening pain, if you cannot eat or drink, or for any other issues whatsoever.  It was a pleasure to take care of you today, and thank you for coming to our emergency department.  If you have any questions or concerns before leaving please ask the nurse to grab me and I'm more than happy to go through your aftercare instructions again.  If you were prescribed any opioid pain medication today such as Norco, Vicodin, Percocet, morphine, hydrocodone, or oxycodone please make sure you do not drive when you are taking this medication as it can alter your ability to drive safely.  If you have any concerns once you are home that you are not improving or are in fact getting worse before you can make it to your follow-up appointment, please do not hesitate to call 911 and come back for further evaluation.  Merrily Brittle, MD  Results for orders placed or performed during the hospital encounter of 09/15/17  Lipase, blood  Result Value Ref Range   Lipase 32 11 - 51 U/L  Comprehensive metabolic panel  Result Value Ref Range   Sodium 137 135 - 145 mmol/L   Potassium 3.4 (L) 3.5 - 5.1 mmol/L   Chloride 105 101 - 111 mmol/L   CO2 23 22 - 32 mmol/L   Glucose, Bld 96 65 - 99 mg/dL   BUN 9 6 - 20 mg/dL   Creatinine, Ser 4.09 0.44 - 1.00 mg/dL   Calcium 8.7 (L) 8.9 - 10.3 mg/dL   Total Protein 7.2 6.5 - 8.1 g/dL   Albumin 3.5 3.5 - 5.0 g/dL   AST 18 15 - 41 U/L   ALT 19 14 - 54 U/L   Alkaline Phosphatase 79 38 - 126 U/L   Total Bilirubin 0.4 0.3 - 1.2 mg/dL   GFR calc non Af Amer >60 >60 mL/min   GFR calc Af Amer >60 >60 mL/min   Anion gap 9 5 - 15  CBC  Result Value Ref Range   WBC 10.6 3.6 - 11.0 K/uL   RBC 4.16 3.80 - 5.20 MIL/uL   Hemoglobin  13.0 12.0 - 16.0 g/dL   HCT 81.1 91.4 - 78.2 %   MCV 91.7 80.0 - 100.0 fL   MCH 31.3 26.0 - 34.0 pg   MCHC 34.1 32.0 - 36.0 g/dL   RDW 95.6 21.3 - 08.6 %   Platelets 311 150 - 440 K/uL  Urinalysis, Complete w Microscopic  Result Value Ref Range   Color, Urine STRAW (A) YELLOW   APPearance CLEAR (A) CLEAR   Specific Gravity, Urine 1.006 1.005 - 1.030   pH 7.0 5.0 - 8.0   Glucose, UA NEGATIVE NEGATIVE mg/dL   Hgb urine dipstick SMALL (A) NEGATIVE   Bilirubin Urine NEGATIVE NEGATIVE   Ketones, ur NEGATIVE NEGATIVE mg/dL   Protein, ur NEGATIVE NEGATIVE mg/dL   Nitrite NEGATIVE NEGATIVE   Leukocytes, UA NEGATIVE NEGATIVE   RBC / HPF 0-5 0 - 5 RBC/hpf   WBC, UA 0-5 0 - 5 WBC/hpf   Bacteria, UA NONE SEEN NONE SEEN   Squamous Epithelial / LPF 0-5 (A) NONE SEEN  Pregnancy, urine POC  Result Value Ref Range   Preg Test, Ur NEGATIVE NEGATIVE   US Abdomen Limited Ruq  Result Date: 09/15/2017 CLINICAL DATA:  37 year old female with intermittent abdominal pain. EXAM: ULTRASOUND ABDOMEN LIMITED RIGHT UPPER QUADRANT COMPARISON:  None. FINDINGS: Gallbladder: The gallbladder is filled with stones with a wall echo shadow appearance. The gallbladder wall is slightly thickened measuring 5 mm. This may be partly related to underdistention. No definite pericholecystic fluid. Negative sonographic Murphy's sign. Common bile duct: Diameter: 3 mm Liver: The liver is unremarkable as visualized. Portal vein is patent on color Doppler imaging with normal direction of blood flow towards the liver. IMPRESSION: Cholelithiasis with mild thickened appearance of the gallbladder wall but no other sonographic evidence of acute cholecystitis. A hepatobiliary scintigraphy may provide better evaluation of the gallbladder if there is a high clinical concern for acute cholecystitis . Electronically Signed   By: Elgie CollardArash  Radparvar M.D.   On: 09/15/2017 02:42

## 2017-09-15 NOTE — ED Provider Notes (Signed)
[facility] Emergency Department Provider Note  ____________________________________________   First MD Initiated Contact with Patient 09/15/17 0133     (approximate)  I have reviewed the triage vital signs and the nursing notes.   HISTORY  Chief Complaint Abdominal Pain   HPI Brianna Murphy is a 37 y.o. female who self presents to the emergency department with roughly 3 weeks of intermittent daily moderate severity cramping diffuse abdominal discomfort.  Symptoms are intermittent and seem to be somewhat worse after eating and associated with nausea.  Nothing in particular is different about her symptoms today she finally had a day off work and was able to come to the hospital.  She is also requesting her Nexplanon be removed from her left upper extremity as she was supposed to have it removed "a few years ago".  Her last menstrual period was 2 weeks ago.  She denies dysuria frequency or hesitancy.  She has no history of abdominal surgeries.  She reports some nausea but no vomiting.  She is able to eat and drink although food clearly makes her symptoms worse.  History reviewed. No pertinent past medical history.  There are no active problems to display for this patient.   History reviewed. No pertinent surgical history.  Prior to Admission medications   Medication Sig Start Date End Date Taking? Authorizing Provider  amoxicillin (AMOXIL) 500 MG capsule Take 1 capsule (500 mg total) by mouth 3 (three) times daily. 05/07/16   Joni Reining, PA-C  ibuprofen (ADVIL,MOTRIN) 600 MG tablet Take 1 tablet (600 mg total) by mouth every 8 (eight) hours as needed. 09/15/17   Merrily Brittle, MD  ibuprofen (ADVIL,MOTRIN) 800 MG tablet Take 1 tablet (800 mg total) by mouth every 8 (eight) hours as needed for moderate pain. 05/07/16   Joni Reining, PA-C  ondansetron (ZOFRAN ODT) 4 MG disintegrating tablet Take 1 tablet (4 mg total) by mouth every 8 (eight) hours as  needed for nausea or vomiting. 09/15/17   Merrily Brittle, MD    Allergies Latex  No family history on file.  Social History Social History   Tobacco Use  . Smoking status: Current Every Day Smoker    Packs/day: 0.50    Types: Cigarettes  . Smokeless tobacco: Never Used  Substance Use Topics  . Alcohol use: No  . Drug use: Not on file    Review of Systems Constitutional: No fever/chills Eyes: No visual changes. ENT: No sore throat. Cardiovascular: Denies chest pain. Respiratory: Denies shortness of breath. Gastrointestinal: Positive for abdominal pain.  Positive for nausea, no vomiting.  No diarrhea.  No constipation. Genitourinary: Negative for dysuria. Musculoskeletal: Negative for back pain. Skin: Negative for rash. Neurological: Negative for headaches, focal weakness or numbness.   ____________________________________________   PHYSICAL EXAM:  VITAL SIGNS: ED Triage Vitals [09/14/17 2338]  Enc Vitals Group     BP (!) 156/94     Pulse Rate 74     Resp 18     Temp 98.4 F (36.9 C)     Temp Source Oral     SpO2 100 %     Weight 250 lb (113.4 kg)     Height 5\' 11"  (1.803 m)     Head Circumference      Peak Flow      Pain Score 5     Pain Loc      Pain Edu?      Excl. in GC?     Constitutional: Alert  and oriented x4 somewhat uncomfortable appearing lying on her left side nontoxic no diaphoresis speaks in full clear sentences Eyes: PERRL EOMI. Head: Atraumatic. Nose: No congestion/rhinnorhea. Mouth/Throat: No trismus Neck: No stridor.   Cardiovascular: Normal rate, regular rhythm. Grossly normal heart sounds.  Good peripheral circulation. Respiratory: Normal respiratory effort.  No retractions. Lungs CTAB and moving good air Gastrointestinal: Obese abdomen diffusely tender with no focality no rebound or guarding or peritonitis negative Murphy's Musculoskeletal: No lower extremity edema   Neurologic:  Normal speech and language. No gross focal  neurologic deficits are appreciated. Skin:  Skin is warm, dry and intact. No rash noted. Psychiatric: Mood and affect are normal. Speech and behavior are normal.    ____________________________________________   DIFFERENTIAL includes but not limited to  Biliary colic, cholecystitis, appendicitis, diverticulitis, colitis, pyelonephritis ____________________________________________   LABS (all labs ordered are listed, but only abnormal results are displayed)  Labs Reviewed  COMPREHENSIVE METABOLIC PANEL - Abnormal; Notable for the following components:      Result Value   Potassium 3.4 (*)    Calcium 8.7 (*)    All other components within normal limits  URINALYSIS, COMPLETE (UACMP) WITH MICROSCOPIC - Abnormal; Notable for the following components:   Color, Urine STRAW (*)    APPearance CLEAR (*)    Hgb urine dipstick SMALL (*)    Squamous Epithelial / LPF 0-5 (*)    All other components within normal limits  LIPASE, BLOOD  CBC  POC URINE PREG, ED  POCT PREGNANCY, URINE    Lab work reviewed by me with no acute disease __________________________________________  EKG   ____________________________________________  RADIOLOGY  Right upper quadrant ultrasound reviewed by me shows gallstones with slight gallbladder wall thickening but negative Murphy's no pericholecystic fluid and normal CBD ____________________________________________   PROCEDURES  Procedure(s) performed: no  Procedures  Critical Care performed: no  Observation: no ____________________________________________   INITIAL IMPRESSION / ASSESSMENT AND PLAN / ED COURSE  Pertinent labs & imaging results that were available during my care of the patient were reviewed by me and considered in my medical decision making (see chart for details).  The patient arrives with 3 weeks of postprandial abdominal pain and nausea.  Abdominal exam is benign and not peritonitis.  This is concerning for possible  biliary colic versus cholecystitis.  Bentyl, ibuprofen, right upper quadrant ultrasound are all pending.     ----------------------------------------- 3:03 AM on 09/15/2017 -----------------------------------------  The patient's pain is nearly completely resolved.  Ultrasound shows a significant amount of gallstones but is not consistent with cholecystitis.  Patient is able to eat and drink.  At this point the patient is medically stable for outpatient management with surgery follow-up this week to plan outpatient cholecystectomy.  The patient verbalizes understanding and agreement with the plan.  Strict return precautions have been given.  ____________________________________________   FINAL CLINICAL IMPRESSION(S) / ED DIAGNOSES  Final diagnoses:  Pain  Biliary colic      NEW MEDICATIONS STARTED DURING THIS VISIT:  New Prescriptions   IBUPROFEN (ADVIL,MOTRIN) 600 MG TABLET    Take 1 tablet (600 mg total) by mouth every 8 (eight) hours as needed.   ONDANSETRON (ZOFRAN ODT) 4 MG DISINTEGRATING TABLET    Take 1 tablet (4 mg total) by mouth every 8 (eight) hours as needed for nausea or vomiting.     Note:  This document was prepared using Dragon voice recognition software and may include unintentional dictation errors.     Merrily Brittleifenbark, Brianna Malanga, MD 09/15/17  0304  

## 2017-09-20 ENCOUNTER — Encounter: Payer: Self-pay | Admitting: Surgery

## 2017-09-20 ENCOUNTER — Ambulatory Visit (INDEPENDENT_AMBULATORY_CARE_PROVIDER_SITE_OTHER): Payer: Self-pay | Admitting: Surgery

## 2017-09-20 VITALS — BP 144/84 | HR 71 | Temp 98.6°F | Ht 70.0 in | Wt 259.2 lb

## 2017-09-20 DIAGNOSIS — K8020 Calculus of gallbladder without cholecystitis without obstruction: Secondary | ICD-10-CM

## 2017-09-20 NOTE — Progress Notes (Signed)
Surgical Clinic History and Physical  Referring provider:  Hyman Hopes, MD [address] [place], [place] [address]  HISTORY OF PRESENT ILLNESS (HPI):  37 y.o. female presents for evaluation of RUQ > upper abdominal pain x ~1 month. Patient reports her pain typically occurs within 1 hour of eating fatty foods such as fried chicken, pizza, and spaghetti with meat sauce and cheese. 1 week ago, patient presented to [facility] ED and was advised to follow-up with surgery. She denies reflux/heartburn, belching, or relief with Tums and otherwise reports +flatus and +BM's WNL without emesis, fever/chills, CP, or SOB.  PAST MEDICAL HISTORY (PMH):  History reviewed. No pertinent past medical history.   PAST SURGICAL HISTORY (PSH):  History reviewed. No pertinent surgical history.   MEDICATIONS:  Prior to Admission medications   Medication Sig Start Date End Date Taking? Authorizing Provider  ibuprofen (ADVIL,MOTRIN) 600 MG tablet Take 1 tablet (600 mg total) by mouth every 8 (eight) hours as needed. 09/15/17  Yes Merrily Brittle, MD     ALLERGIES:  Allergies  Allergen Reactions  . Latex Rash     SOCIAL HISTORY:  Social History   Socioeconomic History  . Marital status: Single    Spouse name: Not on file  . Number of children: Not on file  . Years of education: Not on file  . Highest education level: Not on file  Occupational History  . Not on file  Social Needs  . Financial resource strain: Not on file  . Food insecurity:    Worry: Not on file    Inability: Not on file  . Transportation needs:    Medical: Not on file    Non-medical: Not on file  Tobacco Use  . Smoking status: Current Every Day Smoker    Packs/day: 0.50    Types: Cigarettes  . Smokeless tobacco: Never Used  Substance and Sexual Activity  . Alcohol use: No  . Drug use: Not on file  . Sexual activity: Not on file  Lifestyle  . Physical activity:    Days per week: Not on file    Minutes per  session: Not on file  . Stress: Not on file  Relationships  . Social connections:    Talks on phone: Not on file    Gets together: Not on file    Attends religious service: Not on file    Active member of club or organization: Not on file    Attends meetings of clubs or organizations: Not on file    Relationship status: Not on file  . Intimate partner violence:    Fear of current or ex partner: Not on file    Emotionally abused: Not on file    Physically abused: Not on file    Forced sexual activity: Not on file  Other Topics Concern  . Not on file  Social History Narrative  . Not on file    The patient currently resides (home / rehab facility / nursing home): Home The patient normally is (ambulatory / bedbound): Ambulatory  FAMILY HISTORY:  Family History  Problem Relation Age of Onset  . Hypertension Mother   . Hypertension Father   . Diabetes Paternal Uncle   . Pancreatic cancer Paternal Grandfather   . Diabetes Paternal Grandfather     Otherwise negative/non-contributory.  REVIEW OF SYSTEMS:  Constitutional: denies any other weight loss, fever, chills, or sweats  Eyes: denies any other vision changes, history of eye injury  ENT: denies sore throat,  hearing problems  Respiratory: denies shortness of breath, wheezing  Cardiovascular: denies chest pain, palpitations  Gastrointestinal: abdominal pain, N/V, and bowel function as per HPI Musculoskeletal: denies any other joint pains or cramps  Skin: Denies any other rashes or skin discolorations Neurological: denies any other headache, dizziness, weakness  Psychiatric: Denies any other depression, anxiety   All other review of systems were otherwise negative   VITAL SIGNS:  BP (!) 144/84   Pulse 71   Temp 98.6 F (37 C) (Oral)   Ht 5\' 10"  (1.778 m)   Wt 259 lb 3.2 oz (117.6 kg)   BMI 37.19 kg/m   PHYSICAL EXAM:  Constitutional:  -- Overweight body habitus  -- Awake, alert, and oriented x3  Eyes:  -- Pupils  equally round and reactive to light  -- No scleral icterus  Ear, nose, throat:  -- No jugular venous distension -- No nasal drainage, bleeding Pulmonary:  -- No crackles  -- Equal breath sounds bilaterally -- Breathing non-labored at rest Cardiovascular:  -- S1, S2 present  -- No pericardial rubs  Gastrointestinal:  -- Abdomen soft and non-distended with moderate RUQ abdominal tenderness to palpation, no guarding/rebound tenderness -- No abdominal masses appreciated, pulsatile or otherwise  Musculoskeletal and Integumentary:  -- Wounds or skin discoloration: None appreciated -- Extremities: B/L UE and LE FROM, hands and feet warm, no edema  Neurologic:  -- Motor function: Intact and symmetric -- Sensation: Intact and symmetric  Labs:  CBC Latest Ref Rng & Units 09/14/2017 09/01/2012 08/31/2012  WBC 3.6 - 11.0 K/uL 10.6 - 12.9(H)  Hemoglobin 12.0 - 16.0 g/dL 16.113.0 - 11.1(L)  Hematocrit 35.0 - 47.0 % 38.2 30.8(L) 33.3(L)  Platelets 150 - 440 K/uL 311 - 369   CMP Latest Ref Rng & Units 09/14/2017 01/27/2012  Glucose 65 - 99 mg/dL 96 99  BUN 6 - 20 mg/dL 9 8  Creatinine 0.960.44 - 1.00 mg/dL 0.450.63 4.090.68  Sodium 811135 - 145 mmol/L 137 139  Potassium 3.5 - 5.1 mmol/L 3.4(L) 3.2(L)  Chloride 101 - 111 mmol/L 105 107  CO2 22 - 32 mmol/L 23 23  Calcium 8.9 - 10.3 mg/dL 9.1(Y8.7(L) 8.8  Total Protein 6.5 - 8.1 g/dL 7.2 7.6  Total Bilirubin 0.3 - 1.2 mg/dL 0.4 0.2  Alkaline Phos 38 - 126 U/L 79 94  AST 15 - 41 U/L 18 22  ALT 14 - 54 U/L 19 22    Imaging studies:  Limited RUQ Abdominal Ultrasound (09/15/2017) gallbladder is filled with stones with a wall echo shadow appearance. The gallbladder wall is slightly thickened measuring 5 mm. This may be partly related to underdistention. No definite pericholecystic fluid. Negative sonographic Murphy's sign. Common bile duct diameter measures 3 mm.  Assessment/Plan: (ICD-10's: 61K80.20) 37 y.o. female with symptomatic cholelithiasis without sonographic  evidence to suggest cholecystitis, complicated by pertinent comorbidities including morbid obesity (BMI >37).              - avoid/minimize foods with higher fat content (meats, cheeses/dairy, and fried)             - prefer low-fat vegetables, whole grains (wheat bread, ceareals, etc), and fruits until cholecystectomy              - all risks, benefits, and alternatives to cholecystectomy were discussed with the patient, all of his questions were answered to his expressed satisfaction, patient expresses she wishes to proceed, and informed consent was obtained.             -  will plan for laparoscopic cholecystectomy 4/15 - 4/17 pending anesthesia and OR availability             - anticipate return to clinic 2 weeks after above planned surgery             - instructed to call if any questions or concerns   All of the above recommendations were discussed with the patient, and all of patient's questions were answered to her expressed satisfaction.  Thank you for the opportunity to participate in this patient's care.  -- Scherrie Gerlach Earlene Plater, MD, RPVI [place]: [facility] General [facility] - Partnering for exceptional care. Office: [phone]

## 2017-09-20 NOTE — H&P (View-Only) (Signed)
Surgical Clinic History and Physical  Referring provider:  Hyman Hopes, MD [address] [place], [place] [address]  HISTORY OF PRESENT ILLNESS (HPI):  37 y.o. female presents for evaluation of RUQ > upper abdominal pain x ~1 month. Patient reports her pain typically occurs within 1 hour of eating fatty foods such as fried chicken, pizza, and spaghetti with meat sauce and cheese. 1 week ago, patient presented to [facility] ED and was advised to follow-up with surgery. She denies reflux/heartburn, belching, or relief with Tums and otherwise reports +flatus and +BM's WNL without emesis, fever/chills, CP, or SOB.  PAST MEDICAL HISTORY (PMH):  History reviewed. No pertinent past medical history.   PAST SURGICAL HISTORY (PSH):  History reviewed. No pertinent surgical history.   MEDICATIONS:  Prior to Admission medications   Medication Sig Start Date End Date Taking? Authorizing Provider  ibuprofen (ADVIL,MOTRIN) 600 MG tablet Take 1 tablet (600 mg total) by mouth every 8 (eight) hours as needed. 09/15/17  Yes Merrily Brittle, MD     ALLERGIES:  Allergies  Allergen Reactions  . Latex Rash     SOCIAL HISTORY:  Social History   Socioeconomic History  . Marital status: Single    Spouse name: Not on file  . Number of children: Not on file  . Years of education: Not on file  . Highest education level: Not on file  Occupational History  . Not on file  Social Needs  . Financial resource strain: Not on file  . Food insecurity:    Worry: Not on file    Inability: Not on file  . Transportation needs:    Medical: Not on file    Non-medical: Not on file  Tobacco Use  . Smoking status: Current Every Day Smoker    Packs/day: 0.50    Types: Cigarettes  . Smokeless tobacco: Never Used  Substance and Sexual Activity  . Alcohol use: No  . Drug use: Not on file  . Sexual activity: Not on file  Lifestyle  . Physical activity:    Days per week: Not on file    Minutes per  session: Not on file  . Stress: Not on file  Relationships  . Social connections:    Talks on phone: Not on file    Gets together: Not on file    Attends religious service: Not on file    Active member of club or organization: Not on file    Attends meetings of clubs or organizations: Not on file    Relationship status: Not on file  . Intimate partner violence:    Fear of current or ex partner: Not on file    Emotionally abused: Not on file    Physically abused: Not on file    Forced sexual activity: Not on file  Other Topics Concern  . Not on file  Social History Narrative  . Not on file    The patient currently resides (home / rehab facility / nursing home): Home The patient normally is (ambulatory / bedbound): Ambulatory  FAMILY HISTORY:  Family History  Problem Relation Age of Onset  . Hypertension Mother   . Hypertension Father   . Diabetes Paternal Uncle   . Pancreatic cancer Paternal Grandfather   . Diabetes Paternal Grandfather     Otherwise negative/non-contributory.  REVIEW OF SYSTEMS:  Constitutional: denies any other weight loss, fever, chills, or sweats  Eyes: denies any other vision changes, history of eye injury  ENT: denies sore throat,  hearing problems  Respiratory: denies shortness of breath, wheezing  Cardiovascular: denies chest pain, palpitations  Gastrointestinal: abdominal pain, N/V, and bowel function as per HPI Musculoskeletal: denies any other joint pains or cramps  Skin: Denies any other rashes or skin discolorations Neurological: denies any other headache, dizziness, weakness  Psychiatric: Denies any other depression, anxiety   All other review of systems were otherwise negative   VITAL SIGNS:  BP (!) 144/84   Pulse 71   Temp 98.6 F (37 C) (Oral)   Ht 5\' 10"  (1.778 m)   Wt 259 lb 3.2 oz (117.6 kg)   BMI 37.19 kg/m   PHYSICAL EXAM:  Constitutional:  -- Overweight body habitus  -- Awake, alert, and oriented x3  Eyes:  -- Pupils  equally round and reactive to light  -- No scleral icterus  Ear, nose, throat:  -- No jugular venous distension -- No nasal drainage, bleeding Pulmonary:  -- No crackles  -- Equal breath sounds bilaterally -- Breathing non-labored at rest Cardiovascular:  -- S1, S2 present  -- No pericardial rubs  Gastrointestinal:  -- Abdomen soft and non-distended with moderate RUQ abdominal tenderness to palpation, no guarding/rebound tenderness -- No abdominal masses appreciated, pulsatile or otherwise  Musculoskeletal and Integumentary:  -- Wounds or skin discoloration: None appreciated -- Extremities: B/L UE and LE FROM, hands and feet warm, no edema  Neurologic:  -- Motor function: Intact and symmetric -- Sensation: Intact and symmetric  Labs:  CBC Latest Ref Rng & Units 09/14/2017 09/01/2012 08/31/2012  WBC 3.6 - 11.0 K/uL 10.6 - 12.9(H)  Hemoglobin 12.0 - 16.0 g/dL 16.113.0 - 11.1(L)  Hematocrit 35.0 - 47.0 % 38.2 30.8(L) 33.3(L)  Platelets 150 - 440 K/uL 311 - 369   CMP Latest Ref Rng & Units 09/14/2017 01/27/2012  Glucose 65 - 99 mg/dL 96 99  BUN 6 - 20 mg/dL 9 8  Creatinine 0.960.44 - 1.00 mg/dL 0.450.63 4.090.68  Sodium 811135 - 145 mmol/L 137 139  Potassium 3.5 - 5.1 mmol/L 3.4(L) 3.2(L)  Chloride 101 - 111 mmol/L 105 107  CO2 22 - 32 mmol/L 23 23  Calcium 8.9 - 10.3 mg/dL 9.1(Y8.7(L) 8.8  Total Protein 6.5 - 8.1 g/dL 7.2 7.6  Total Bilirubin 0.3 - 1.2 mg/dL 0.4 0.2  Alkaline Phos 38 - 126 U/L 79 94  AST 15 - 41 U/L 18 22  ALT 14 - 54 U/L 19 22    Imaging studies:  Limited RUQ Abdominal Ultrasound (09/15/2017) gallbladder is filled with stones with a wall echo shadow appearance. The gallbladder wall is slightly thickened measuring 5 mm. This may be partly related to underdistention. No definite pericholecystic fluid. Negative sonographic Murphy's sign. Common bile duct diameter measures 3 mm.  Assessment/Plan: (ICD-10's: 61K80.20) 37 y.o. female with symptomatic cholelithiasis without sonographic  evidence to suggest cholecystitis, complicated by pertinent comorbidities including morbid obesity (BMI >37).              - avoid/minimize foods with higher fat content (meats, cheeses/dairy, and fried)             - prefer low-fat vegetables, whole grains (wheat bread, ceareals, etc), and fruits until cholecystectomy              - all risks, benefits, and alternatives to cholecystectomy were discussed with the patient, all of his questions were answered to his expressed satisfaction, patient expresses she wishes to proceed, and informed consent was obtained.             -  will plan for laparoscopic cholecystectomy 4/15 - 4/17 pending anesthesia and OR availability             - anticipate return to clinic 2 weeks after above planned surgery             - instructed to call if any questions or concerns   All of the above recommendations were discussed with the patient, and all of patient's questions were answered to her expressed satisfaction.  Thank you for the opportunity to participate in this patient's care.  -- Scherrie Gerlach Earlene Plater, MD, RPVI [place]: [facility] General [facility] - Partnering for exceptional care. Office: [phone]

## 2017-09-20 NOTE — Patient Instructions (Signed)
You have requested to have your gallbladder removed. This will be done on the week of April15-19 at [facility] with Dr. Satira Mccallum..  You will most likely be out of work 1-2 weeks for this surgery. You will return after your post-op appointment with a lifting restriction for approximately 4 more weeks.  You will be able to eat anything you would like to following surgery. But, start by eating a bland diet and advance this as tolerated. The Gallbladder diet is below, please go as closely by this diet as possible prior to surgery to avoid any further attacks.  Please see the (blue)pre-care form that you have been given today. If you have any questions, please call our office.  Laparoscopic Cholecystectomy Laparoscopic cholecystectomy is surgery to remove the gallbladder. The gallbladder is located in the upper right part of the abdomen, behind the liver. It is a storage sac for bile, which is produced in the liver. Bile aids in the digestion and absorption of fats. Cholecystectomy is often done for inflammation of the gallbladder (cholecystitis). This condition is usually caused by a buildup of gallstones (cholelithiasis) in the gallbladder. Gallstones can block the flow of bile, and that can result in inflammation and pain. In severe cases, emergency surgery may be required. If emergency surgery is not required, you will have time to prepare for the procedure. Laparoscopic surgery is an alternative to open surgery. Laparoscopic surgery has a shorter recovery time. Your common bile duct may also need to be examined during the procedure. If stones are found in the common bile duct, they may be removed. LET [facility] CARE PROVIDER KNOW ABOUT:  Any allergies you have.  All medicines you are taking, including vitamins, herbs, eye drops, creams, and over-the-counter medicines.  Previous problems you or members of your family have had with the use of anesthetics.  Any blood disorders you  have.  Previous surgeries you have had.    Any medical conditions you have. RISKS AND COMPLICATIONS Generally, this is a safe procedure. However, problems may occur, including:  Infection.  Bleeding.  Allergic reactions to medicines.  Damage to other structures or organs.  A stone remaining in the common bile duct.  A bile leak from the cyst duct that is clipped when your gallbladder is removed.  The need to convert to open surgery, which requires a larger incision in the abdomen. This may be necessary if your surgeon thinks that it is not safe to continue with a laparoscopic procedure. BEFORE THE PROCEDURE  Ask your health care provider about:  Changing or stopping your regular medicines. This is especially important if you are taking diabetes medicines or blood thinners.  Taking medicines such as aspirin and ibuprofen. These medicines can thin your blood. Do not take these medicines before your procedure if your health care provider instructs you not to.  Follow instructions from your health care provider about eating or drinking restrictions.  Let your health care provider know if you develop a cold or an infection before surgery.  Plan to have someone take you home after the procedure.  Ask your health care provider how your surgical site will be marked or identified.  You may be given antibiotic medicine to help prevent infection. PROCEDURE  To reduce your risk of infection:  Your health care team will wash or sanitize their hands.  Your skin will be washed with soap.  An IV tube may be inserted into one of your veins.  You will be given  a medicine to make you fall asleep (general anesthetic).  A breathing tube will be placed in your mouth.  The surgeon will make several small cuts (incisions) in your abdomen.  A thin, lighted tube (laparoscope) that has a tiny camera on the end will be inserted through one of the small incisions. The camera on the  laparoscope will send a picture to a TV screen (monitor) in the operating room. This will give the surgeon a good view inside your abdomen.  A gas will be pumped into your abdomen. This will expand your abdomen to give the surgeon more room to perform the surgery.  Other tools that are needed for the procedure will be inserted through the other incisions. The gallbladder will be removed through one of the incisions.  After your gallbladder has been removed, the incisions will be closed with stitches (sutures), staples, or skin glue.  Your incisions may be covered with a bandage (dressing). The procedure may vary among health care providers and hospitals. AFTER THE PROCEDURE  Your blood pressure, heart rate, breathing rate, and blood oxygen level will be monitored often until the medicines you were given have worn off.  You will be given medicines as needed to control your pain.   This information is not intended to replace advice given to you by your health care provider. Make sure you discuss any questions you have with your health care provider.   Document Released: 06/06/2005 Document Revised: 02/25/2015 Document Reviewed: 01/16/2013 Elsevier Interactive Patient Education 2016 Elsevier Inc.   Low-Fat Diet for Gallbladder Conditions A low-fat diet can be helpful if you have pancreatitis or a gallbladder condition. With these conditions, your pancreas and gallbladder have trouble digesting fats. A healthy eating plan with less fat will help rest your pancreas and gallbladder and reduce your symptoms. WHAT DO I NEED TO KNOW ABOUT THIS DIET?  Eat a low-fat diet.  Reduce your fat intake to less than 20-30% of your total daily calories. This is less than 50-60 g of fat per day.  Remember that you need some fat in your diet. Ask your dietician what your daily goal should be.  Choose nonfat and low-fat healthy foods. Look for the words "nonfat," "low fat," or "fat free."  As a guide,  look on the label and choose foods with less than 3 g of fat per serving. Eat only one serving.  Avoid alcohol.  Do not smoke. If you need help quitting, talk with your health care provider.  Eat small frequent meals instead of three large heavy meals. WHAT FOODS CAN I EAT? Grains Include healthy grains and starches such as potatoes, wheat bread, fiber-rich cereal, and brown rice. Choose whole grain options whenever possible. In adults, whole grains should account for 45-65% of your daily calories.  Fruits and Vegetables Eat plenty of fruits and vegetables. Fresh fruits and vegetables add fiber to your diet. Meats and Other Protein Sources Eat lean meat such as chicken and pork. Trim any fat off of meat before cooking it. Eggs, fish, and beans are other sources of protein. In adults, these foods should account for 10-35% of your daily calories. Dairy Choose low-fat milk and dairy options. Dairy includes fat and protein, as well as calcium.  Fats and Oils Limit high-fat foods such as fried foods, sweets, baked goods, sugary drinks.  Other Creamy sauces and condiments, such as mayonnaise, can add extra fat. Think about whether or not you need to use them, or use smaller amounts  or low fat options. WHAT FOODS ARE NOT RECOMMENDED?  High fat foods, such as:  [employer].  Ice cream.  [place] toast.  Sweet rolls.  Pizza.  Cheese bread.  Foods covered with batter, butter, creamy sauces, or cheese.  Fried foods.  Sugary drinks and desserts.  Foods that cause gas or bloating   This information is not intended to replace advice given to you by your health care provider. Make sure you discuss any questions you have with your health care provider.   Document Released: 06/11/2013 Document Reviewed: 06/11/2013 Elsevier Interactive Patient Education [employer]

## 2017-09-21 NOTE — Addendum Note (Signed)
Addended by: Chrisandra NettersAVIS, Sharmain Lastra E on: 09/21/2017 11:15 AM   Modules accepted: Orders, SmartSet

## 2017-09-22 ENCOUNTER — Telehealth: Payer: Self-pay | Admitting: General Practice

## 2017-09-22 NOTE — Telephone Encounter (Signed)
Patient called said she had a missed call from [place], theres no note in patients chart, patient said she thinks it may have something to do with her surgery. Please call patient and advise.

## 2017-09-25 NOTE — Telephone Encounter (Signed)
Pt advised of pre op date/time and sx date. Sx: 10/05/17 with Dr Davis--laparoscopic cholecystectomy.  Pre op: 09/29/17 between 9-1:00pm--phone interview.   Patient made aware to call [phone], between 1-3:00pm the day before surgery, to find out what time to arrive.    Patient has agreed to make a depot of 300.00 prior to surgery.

## 2017-09-29 ENCOUNTER — Inpatient Hospital Stay: Admission: RE | Admit: 2017-09-29 | Payer: Medicaid Other | Source: Ambulatory Visit

## 2017-10-02 ENCOUNTER — Inpatient Hospital Stay: Admission: RE | Admit: 2017-10-02 | Payer: Medicaid Other | Source: Ambulatory Visit

## 2017-10-03 ENCOUNTER — Other Ambulatory Visit: Payer: Self-pay

## 2017-10-03 ENCOUNTER — Encounter: Payer: Self-pay | Admitting: *Deleted

## 2017-10-03 ENCOUNTER — Encounter
Admission: RE | Admit: 2017-10-03 | Discharge: 2017-10-03 | Disposition: A | Discharge status: Home / Self Care | Payer: Medicaid Other | Source: Ambulatory Visit | Attending: Surgery | Admitting: Surgery

## 2017-10-03 HISTORY — DX: Calculus of gallbladder without cholecystitis without obstruction: K80.20

## 2017-10-03 NOTE — Patient Instructions (Signed)
Your procedure is scheduled on: 10-05-17  Report to Same Day Surgery 2nd floor medical mall [facility](Medical Mall Entrance-take elevator on left to 2nd floor.  Check in with surgery information desk.) To find out your arrival time please call [phone] between 1PM - 3PM on 10-04-17  Remember: Instructions that are not followed completely may result in serious medical risk, up to and including death, or upon the discretion of your surgeon and anesthesiologist your surgery may need to be rescheduled.    _x___ 1. Do not eat food after midnight the night before your procedure. You may drink clear liquids up to 2 hours before you are scheduled to arrive at the hospital for your procedure.  Do not drink clear liquids within 2 hours of your scheduled arrival to the hospital.  Clear liquids include  --Water or Apple juice without pulp  --Clear carbohydrate beverage such as ClearFast or Gatorade  --Black Coffee or Clear Tea (No milk, no creamers, do not add anything to the coffee or Tea      __x__ 2. No Alcohol for 24 hours before or after surgery.   __x__3. No Smoking or e-cigarettes for 24 prior to surgery.  Do not use any chewable tobacco products for at least 6 hour prior to surgery   ____  4. Bring all medications with you on the day of surgery if instructed.    __x__ 5. Notify your doctor if there is any change in your medical condition     (cold, fever, infections).    x___6. On the morning of surgery brush your teeth with toothpaste and water.  You may rinse your mouth with mouth wash if you wish.  Do not swallow any toothpaste or mouthwash.   Do not wear jewelry, make-up, hairpins, clips or nail polish.  Do not wear lotions, powders, or perfumes. You may wear deodorant.  Do not shave 48 hours prior to surgery. Men may shave face and neck.  Do not bring valuables to the hospital.    [facility] is not responsible for any belongings or valuables.               Contacts, dentures or  bridgework may not be worn into surgery.  Leave your suitcase in the car. After surgery it may be brought to your room.  For patients admitted to the hospital, discharge time is determined by your treatment team.  _  Patients discharged the day of surgery will not be allowed to drive home.  You will need someone to drive you home and stay with you the night of your procedure.     ____ Take anti-hypertensive listed below, cardiac, seizure, asthma, anti-reflux and psychiatric medicines. These include:  1. NONE  2.  3.  4.  5.  6.  ____Fleets enema or Magnesium Citrate as directed.   ____ Use CHG Soap or sage wipes as directed on instruction sheet   ____ Use inhalers on the day of surgery and bring to hospital day of surgery  ____ Stop Metformin and Janumet 2 days prior to surgery.    ____ Take 1/2 of usual insulin dose the night before surgery and none on the morning surgery.   ____ Follow recommendations from Cardiologist, Pulmonologist or PCP regarding stopping Aspirin, Coumadin, Plavix ,Eliquis, Effient, or Pradaxa, and Pletal.  X____Stop Anti-inflammatories such as Advil, Aleve, Ibuprofen, Motrin, Naproxen, Naprosyn, Goodies powders or aspirin products NOW-OK to take Tylenol    ____ Stop supplements until after surgery.  ____ Bring C-Pap to the hospital.

## 2017-10-04 MED ORDER — CEFAZOLIN SODIUM-DEXTROSE 2-4 GM/100ML-% IV SOLN
2.0000 g | INTRAVENOUS | Status: AC
  Administered 2017-10-05: 2 g via INTRAVENOUS
  Filled 2017-10-04: qty 100

## 2017-10-05 ENCOUNTER — Ambulatory Visit: Payer: Self-pay | Admitting: Certified Registered Nurse Anesthetist

## 2017-10-05 ENCOUNTER — Encounter
Admission: RE | Disposition: A | Discharge status: Home / Self Care | Payer: Self-pay | Source: Ambulatory Visit | Attending: Surgery

## 2017-10-05 ENCOUNTER — Ambulatory Visit
Admission: RE | Admit: 2017-10-05 | Discharge: 2017-10-05 | Disposition: A | Discharge status: Home / Self Care | Payer: Self-pay | Source: Ambulatory Visit | Attending: Surgery | Admitting: Surgery

## 2017-10-05 DIAGNOSIS — I889 Nonspecific lymphadenitis, unspecified: Secondary | ICD-10-CM | POA: Insufficient documentation

## 2017-10-05 DIAGNOSIS — Z6837 Body mass index (BMI) 37.0-37.9, adult: Secondary | ICD-10-CM | POA: Insufficient documentation

## 2017-10-05 DIAGNOSIS — K8010 Calculus of gallbladder with chronic cholecystitis without obstruction: Secondary | ICD-10-CM | POA: Insufficient documentation

## 2017-10-05 DIAGNOSIS — F17210 Nicotine dependence, cigarettes, uncomplicated: Secondary | ICD-10-CM | POA: Insufficient documentation

## 2017-10-05 DIAGNOSIS — K8020 Calculus of gallbladder without cholecystitis without obstruction: Secondary | ICD-10-CM

## 2017-10-05 HISTORY — PX: CHOLECYSTECTOMY: SHX55

## 2017-10-05 LAB — POCT PREGNANCY, URINE: Preg Test, Ur: NEGATIVE

## 2017-10-05 SURGERY — LAPAROSCOPIC CHOLECYSTECTOMY
Anesthesia: General | Wound class: Clean Contaminated

## 2017-10-05 MED ORDER — FENTANYL CITRATE (PF) 100 MCG/2ML IJ SOLN
INTRAMUSCULAR | Status: DC | PRN
  Administered 2017-10-05: 50 ug via INTRAVENOUS
  Administered 2017-10-05 (×2): 100 ug via INTRAVENOUS
  Administered 2017-10-05: 50 ug via INTRAVENOUS
  Administered 2017-10-05: 100 ug via INTRAVENOUS

## 2017-10-05 MED ORDER — CHLORHEXIDINE GLUCONATE CLOTH 2 % EX PADS: 6.0000 | MEDICATED_PAD | Freq: Once | CUTANEOUS | Status: DC

## 2017-10-05 MED ORDER — PROPOFOL 10 MG/ML IV BOLUS
INTRAVENOUS | Status: DC | PRN
  Administered 2017-10-05: 160 mg via INTRAVENOUS

## 2017-10-05 MED ORDER — ACETAMINOPHEN 10 MG/ML IV SOLN
INTRAVENOUS | Status: AC
  Filled 2017-10-05: qty 100

## 2017-10-05 MED ORDER — ONDANSETRON HCL 4 MG/2ML IJ SOLN
INTRAMUSCULAR | Status: AC
  Filled 2017-10-05: qty 2

## 2017-10-05 MED ORDER — ROCURONIUM BROMIDE 50 MG/5ML IV SOLN
INTRAVENOUS | Status: AC
  Filled 2017-10-05: qty 1

## 2017-10-05 MED ORDER — LIDOCAINE HCL (CARDIAC) PF 100 MG/5ML IV SOSY
PREFILLED_SYRINGE | INTRAVENOUS | Status: DC | PRN
  Administered 2017-10-05: 80 mg via INTRAVENOUS

## 2017-10-05 MED ORDER — DEXAMETHASONE SODIUM PHOSPHATE 10 MG/ML IJ SOLN
INTRAMUSCULAR | Status: AC
  Filled 2017-10-05: qty 1

## 2017-10-05 MED ORDER — CEFAZOLIN SODIUM-DEXTROSE 2-3 GM-%(50ML) IV SOLR
INTRAVENOUS | Status: AC
  Filled 2017-10-05: qty 50

## 2017-10-05 MED ORDER — FENTANYL CITRATE (PF) 100 MCG/2ML IJ SOLN
25.0000 ug | INTRAMUSCULAR | Status: DC | PRN
  Administered 2017-10-05 (×4): 25 ug via INTRAVENOUS

## 2017-10-05 MED ORDER — PROPOFOL 10 MG/ML IV BOLUS
INTRAVENOUS | Status: AC
  Filled 2017-10-05: qty 20

## 2017-10-05 MED ORDER — LACTATED RINGERS IV SOLN
INTRAVENOUS | Status: DC
  Administered 2017-10-05 (×2): via INTRAVENOUS

## 2017-10-05 MED ORDER — FENTANYL CITRATE (PF) 250 MCG/5ML IJ SOLN
INTRAMUSCULAR | Status: AC
Start: 2017-10-05 — End: ?
  Filled 2017-10-05: qty 5

## 2017-10-05 MED ORDER — LIDOCAINE HCL (PF) 2 % IJ SOLN
INTRAMUSCULAR | Status: AC
  Filled 2017-10-05: qty 10

## 2017-10-05 MED ORDER — MIDAZOLAM HCL 2 MG/2ML IJ SOLN
INTRAMUSCULAR | Status: AC
  Filled 2017-10-05: qty 2

## 2017-10-05 MED ORDER — LIDOCAINE HCL (PF) 1 % IJ SOLN
INTRAMUSCULAR | Status: AC
  Filled 2017-10-05: qty 30

## 2017-10-05 MED ORDER — OXYCODONE-ACETAMINOPHEN 5-325 MG PO TABS
1.0000 | ORAL_TABLET | ORAL | Status: DC | PRN
  Administered 2017-10-05: 1 via ORAL

## 2017-10-05 MED ORDER — DEXAMETHASONE SODIUM PHOSPHATE 10 MG/ML IJ SOLN
INTRAMUSCULAR | Status: DC | PRN
  Administered 2017-10-05: 10 mg via INTRAVENOUS

## 2017-10-05 MED ORDER — MIDAZOLAM HCL 2 MG/2ML IJ SOLN
2.0000 mg | Freq: Once | INTRAMUSCULAR | Status: AC
  Administered 2017-10-05: 2 mg via INTRAVENOUS

## 2017-10-05 MED ORDER — FENTANYL CITRATE (PF) 100 MCG/2ML IJ SOLN
INTRAMUSCULAR | Status: AC
  Filled 2017-10-05: qty 2

## 2017-10-05 MED ORDER — SUGAMMADEX SODIUM 200 MG/2ML IV SOLN
INTRAVENOUS | Status: AC
  Filled 2017-10-05: qty 2

## 2017-10-05 MED ORDER — FENTANYL CITRATE (PF) 100 MCG/2ML IJ SOLN
INTRAMUSCULAR | Status: AC
  Administered 2017-10-05: 25 ug via INTRAVENOUS
  Filled 2017-10-05: qty 2

## 2017-10-05 MED ORDER — OXYCODONE-ACETAMINOPHEN 5-325 MG PO TABS: 1.0000 | ORAL_TABLET | ORAL | 0 refills | Status: DC | PRN

## 2017-10-05 MED ORDER — FENTANYL CITRATE (PF) 250 MCG/5ML IJ SOLN
INTRAMUSCULAR | Status: AC
  Filled 2017-10-05: qty 5

## 2017-10-05 MED ORDER — LIDOCAINE HCL 1 % IJ SOLN
INTRAMUSCULAR | Status: DC | PRN
  Administered 2017-10-05: 20 mL via INTRAMUSCULAR

## 2017-10-05 MED ORDER — ACETAMINOPHEN 500 MG PO TABS
ORAL_TABLET | ORAL | Status: AC
  Administered 2017-10-05: 1000 mg via ORAL
  Filled 2017-10-05: qty 2

## 2017-10-05 MED ORDER — OXYCODONE-ACETAMINOPHEN 5-325 MG PO TABS
ORAL_TABLET | ORAL | Status: AC
  Filled 2017-10-05: qty 1

## 2017-10-05 MED ORDER — ONDANSETRON HCL 4 MG/2ML IJ SOLN
INTRAMUSCULAR | Status: DC | PRN
  Administered 2017-10-05: 4 mg via INTRAVENOUS

## 2017-10-05 MED ORDER — MIDAZOLAM HCL 2 MG/2ML IJ SOLN
INTRAMUSCULAR | Status: DC | PRN
  Administered 2017-10-05: 2 mg via INTRAVENOUS

## 2017-10-05 MED ORDER — ROCURONIUM BROMIDE 100 MG/10ML IV SOLN
INTRAVENOUS | Status: DC | PRN
  Administered 2017-10-05: 20 mg via INTRAVENOUS
  Administered 2017-10-05: 50 mg via INTRAVENOUS

## 2017-10-05 MED ORDER — SUGAMMADEX SODIUM 200 MG/2ML IV SOLN
INTRAVENOUS | Status: DC | PRN
  Administered 2017-10-05: 200 mg via INTRAVENOUS

## 2017-10-05 MED ORDER — ONDANSETRON HCL 4 MG/2ML IJ SOLN: 4.0000 mg | Freq: Once | INTRAMUSCULAR | Status: DC | PRN

## 2017-10-05 MED ORDER — BUPIVACAINE HCL (PF) 0.5 % IJ SOLN
INTRAMUSCULAR | Status: AC
  Filled 2017-10-05: qty 30

## 2017-10-05 MED ORDER — FAMOTIDINE 20 MG PO TABS
ORAL_TABLET | ORAL | Status: AC
  Administered 2017-10-05: 20 mg via ORAL
  Filled 2017-10-05: qty 1

## 2017-10-05 MED ORDER — FAMOTIDINE 20 MG PO TABS
20.0000 mg | ORAL_TABLET | Freq: Once | ORAL | Status: AC
  Administered 2017-10-05: 20 mg via ORAL

## 2017-10-05 MED ORDER — ACETAMINOPHEN 500 MG PO TABS
1000.0000 mg | ORAL_TABLET | ORAL | Status: AC
  Administered 2017-10-05: 1000 mg via ORAL

## 2017-10-05 MED ORDER — EPHEDRINE SULFATE 50 MG/ML IJ SOLN
INTRAMUSCULAR | Status: DC | PRN
  Administered 2017-10-05: 15 mg via INTRAVENOUS

## 2017-10-05 MED ORDER — SEVOFLURANE IN SOLN
RESPIRATORY_TRACT | Status: AC
  Filled 2017-10-05: qty 250

## 2017-10-05 SURGICAL SUPPLY — 34 items
APPLIER CLIP ROT 10 11.4 M/L (STAPLE) ×3
CHLORAPREP W/TINT 26ML (MISCELLANEOUS) ×3 IMPLANT
CLIP APPLIE ROT 10 11.4 M/L (STAPLE) ×1 IMPLANT
DECANTER SPIKE VIAL GLASS SM (MISCELLANEOUS) ×6 IMPLANT
DERMABOND ADVANCED (GAUZE/BANDAGES/DRESSINGS) ×2
DERMABOND ADVANCED .7 DNX12 (GAUZE/BANDAGES/DRESSINGS) ×1 IMPLANT
DRESSING SURGICEL FIBRLLR 1X2 (HEMOSTASIS) IMPLANT
DRSG SURGICEL FIBRILLAR 1X2 (HEMOSTASIS)
ELECT REM PT RETURN 9FT ADLT (ELECTROSURGICAL) ×3
ELECTRODE REM PT RTRN 9FT ADLT (ELECTROSURGICAL) ×1 IMPLANT
GLOVE BIO SURGEON STRL SZ7 (GLOVE) ×3 IMPLANT
GLOVE BIOGEL PI IND STRL 7.5 (GLOVE) ×1 IMPLANT
GLOVE BIOGEL PI INDICATOR 7.5 (GLOVE) ×2
GOWN STRL REUS W/ TWL LRG LVL3 (GOWN DISPOSABLE) ×3 IMPLANT
GOWN STRL REUS W/TWL LRG LVL3 (GOWN DISPOSABLE) ×6
GRASPER SUT TROCAR 14GX15 (MISCELLANEOUS) ×3 IMPLANT
IRRIGATION STRYKERFLOW (MISCELLANEOUS) IMPLANT
IRRIGATOR STRYKERFLOW (MISCELLANEOUS)
IV NS 1000ML (IV SOLUTION) ×2
IV NS 1000ML BAXH (IV SOLUTION) ×1 IMPLANT
KIT TURNOVER KIT A (KITS) ×3 IMPLANT
NEEDLE HYPO 22GX1.5 SAFETY (NEEDLE) ×3 IMPLANT
NEEDLE INSUFFLATION 14GA 120MM (NEEDLE) ×3 IMPLANT
NS IRRIG 1000ML POUR BTL (IV SOLUTION) ×3 IMPLANT
PACK LAP CHOLECYSTECTOMY (MISCELLANEOUS) ×3 IMPLANT
POUCH SPECIMEN RETRIEVAL 10MM (ENDOMECHANICALS) ×3 IMPLANT
SCISSORS METZENBAUM CVD 33 (INSTRUMENTS) IMPLANT
SLEEVE ENDOPATH XCEL 5M (ENDOMECHANICALS) ×6 IMPLANT
SUT MNCRL AB 4-0 PS2 18 (SUTURE) ×3 IMPLANT
SUT VICRYL 0 UR6 27IN ABS (SUTURE) ×3 IMPLANT
SUT VICRYL AB 3-0 FS1 BRD 27IN (SUTURE) ×6 IMPLANT
TROCAR XCEL NON-BLD 11X100MML (ENDOMECHANICALS) ×3 IMPLANT
TROCAR XCEL NON-BLD 5MMX100MML (ENDOMECHANICALS) ×3 IMPLANT
TUBING INSUFFLATION (TUBING) ×3 IMPLANT

## 2017-10-05 NOTE — Interval H&P Note (Signed)
History and Physical Interval Note:  10/05/2017 9:32 AM  Brianna Murphy  has presented today for surgery, with the diagnosis of CALCULUS OF GALLBLADDER  The various methods of treatment have been discussed with the patient and family. After consideration of risks, benefits and other options for treatment, the patient has consented to  Procedure(s): LAPAROSCOPIC CHOLECYSTECTOMY (N/A) as a surgical intervention .  The patient's history has been reviewed, patient examined, no change in status, stable for surgery.  I have reviewed the patient's chart and labs.  Questions were answered to the patient's satisfaction.     Ancil LinseyJason Evan Odesser Tourangeau

## 2017-10-05 NOTE — Discharge Instructions (Addendum)
In addition to included general post-operative instructions for Laparoscopic Cholecystectomy,  Diet: Gradually resume home heart healthy diet (as discussed).   Activity: No heavy lifting >20 pounds (children, pets, laundry, garbage) or strenuous activity until follow-up, but light activity and walking are encouraged. Do not drive or drink alcohol if taking narcotic pain medications.  Wound care: 2 days after surgery (Saturday, 4/20), you may shower/get incision wet with soapy water and pat dry (do not rub incisions), but no baths or submerging incision underwater until follow-up.   Medications: Resume all home medications. For mild to moderate pain: acetaminophen (Tylenol) or ibuprofen/naproxen (if no kidney disease). Combining Tylenol with alcohol can substantially increase your risk of causing liver disease. Narcotic pain medications, if prescribed, can be used for severe pain, though may cause nausea, constipation, and drowsiness. Do not combine Tylenol and Percocet (or similar) within a 6 hour period as Percocet (and similar) contain(s) Tylenol. If you do not need the narcotic pain medication, you do not need to fill the prescription.  Call office [phone] at any time if any questions, worsening pain, fevers/chills, bleeding, drainage from incision site, or other concerns.    AMBULATORY SURGERY  DISCHARGE INSTRUCTIONS   1) The drugs that you were given will stay in your system until tomorrow so for the next 24 hours you should not:  A) Drive an automobile B) Make any legal decisions C) Drink any alcoholic beverage   2) You may resume regular meals tomorrow.  Today it is better to start with liquids and gradually work up to solid foods.  You may eat anything you prefer, but it is better to start with liquids, then soup and crackers, and gradually work up to solid foods.   3) Please notify your doctor immediately if you have any unusual bleeding, trouble breathing, redness and  pain at the surgery site, drainage, fever, or pain not relieved by medication.    4) Additional Instructions:        Please contact your physician with any problems or Same Day Surgery at [phone], Monday through Friday 6 am to 4 pm, or [place] at [facility] Main number at [phone].

## 2017-10-05 NOTE — OR Nursing (Signed)
Discussed discharge instructions with family. All voice understanding.

## 2017-10-05 NOTE — Progress Notes (Signed)
Report received from DestrehanJeanena, RN and care assumed for this pt at this time. VS and orders assessed and pt is alert, talkative and pleasant and has no s/s of distress. Will continue to monitor and tx pt according to MD orders.

## 2017-10-05 NOTE — Anesthesia Preprocedure Evaluation (Signed)
Anesthesia Evaluation  Patient identified by MRN, date of birth, ID band Patient awake    Reviewed: Allergy & Precautions, H&P , NPO status , Patient's Chart, lab work & pertinent test results, reviewed documented beta blocker date and time   Airway Mallampati: II   Neck ROM: full    Dental  (+) Poor Dentition   Pulmonary neg pulmonary ROS, Current Smoker,    Pulmonary exam normal        Cardiovascular negative cardio ROS Normal cardiovascular exam Rhythm:regular Rate:Normal     Neuro/Psych negative neurological ROS  negative psych ROS   GI/Hepatic negative GI ROS, Neg liver ROS,   Endo/Other  negative endocrine ROS  Renal/GU negative Renal ROS  negative genitourinary   Musculoskeletal   Abdominal   Peds  Hematology negative hematology ROS (+)   Anesthesia Other Findings Past Medical History: No date: Gallstones History reviewed. No pertinent surgical history.   Reproductive/Obstetrics negative OB ROS                             Anesthesia Physical Anesthesia Plan  ASA: III  Anesthesia Plan: General   Post-op Pain Management:    Induction:   PONV Risk Score and Plan:   Airway Management Planned:   Additional Equipment:   Intra-op Plan:   Post-operative Plan:   Informed Consent: I have reviewed the patients History and Physical, chart, labs and discussed the procedure including the risks, benefits and alternatives for the proposed anesthesia with the patient or authorized representative who has indicated his/her understanding and acceptance.   Dental Advisory Given  Plan Discussed with: CRNA  Anesthesia Plan Comments:         Anesthesia Quick Evaluation

## 2017-10-05 NOTE — Anesthesia Postprocedure Evaluation (Signed)
Anesthesia Post Note  Patient: Brianna Murphy  Procedure(s) Performed: LAPAROSCOPIC CHOLECYSTECTOMY (N/A )  Patient location during evaluation: PACU Anesthesia Type: General Level of consciousness: awake and alert Pain management: pain level controlled Vital Signs Assessment: post-procedure vital signs reviewed and stable Respiratory status: spontaneous breathing, nonlabored ventilation, respiratory function stable and patient connected to nasal cannula oxygen Cardiovascular status: blood pressure returned to baseline and stable Postop Assessment: no apparent nausea or vomiting Anesthetic complications: no     Last Vitals:  Vitals:   10/05/17 1325 10/05/17 1338  BP: (!) 147/95 (!) 160/89  Pulse:    Resp:    Temp:  36.9 C  SpO2:  98%    Last Pain:  Vitals:   10/05/17 1338  TempSrc:   PainSc: 2                  Yevette EdwardsJames G Adams

## 2017-10-05 NOTE — Anesthesia Post-op Follow-up Note (Signed)
Anesthesia QCDR form completed.        

## 2017-10-05 NOTE — Op Note (Signed)
SURGICAL OPERATIVE REPORT   DATE OF PROCEDURE: 10/05/2017  ATTENDING Surgeon(s): Ancil Linseyavis, Jason Evan, MD  ANESTHESIA: GETA  PRE-OPERATIVE DIAGNOSIS: Symptomatic Cholelithiasis (K80.20) with gallbladder filled with gallstones  POST-OPERATIVE DIAGNOSIS: Symptomatic Cholelithiasis (K80.20) with gallbladder filled with gallstones  PROCEDURE(S): (cpt's: 47562) 1.) Laparoscopic Cholecystectomy  INTRAOPERATIVE FINDINGS: Mild pericholecystic inflammation with cystic duct and cystic artery clips well-secured, hemostasis at completion of procedure  INTRAOPERATIVE FLUIDS: 1100 mL crystalloid   ESTIMATED BLOOD LOSS: Minimal (<30 mL)   URINE OUTPUT: No foley  SPECIMENS: Gallbladder  IMPLANTS: None  DRAINS: None   COMPLICATIONS: None apparent   CONDITION AT COMPLETION: Hemodynamically stable and extubated  DISPOSITION: PACU   INDICATION(S) FOR PROCEDURE:  Patient is a 37 y.o. female who recently presented with post-prandial RUQ > epigastric abdominal pain after eating fatty foods in particular. Ultrasound suggested cholelithiasis with her gallbladder filled with stones and without sonographic evidence to suggest cholecystitis. All risks, benefits, and alternatives to above elective procedures were discussed with the patient, who elected to proceed, and informed consent was accordingly obtained at that time.   DETAILS OF PROCEDURE:  Patient was brought to the operating suite and appropriately identified. General anesthesia was administered along with peri-operative prophylactic IV antibiotics, and endotracheal intubation was performed by anesthesiologist, along with NG/OG tube for gastric decompression. In supine position, operative site was prepped and draped in usual sterile fashion, and following a brief time out, initial 5 mm incision was made in a natural skin crease just above the umbilicus. Fascia was then elevated, and a Verress needle was inserted and its proper position confirmed  using aspiration and saline meniscus test.  Upon insufflation of the abdominal cavity with carbon dioxide to a well-tolerated pressure of 12-15 mmHg, 5 mm peri-umbilical port followed by laparoscope were inserted and used to inspect the abdominal cavity and its contents with no injuries from insertion of the first trochar noted. Three additional trocars were inserted, one at the epigastric position (10 mm) and two along the Right costal margin (5 mm). The table was then placed in reverse Trendelenburg position with the Right side up. Filmy adhesions between the gallbladder and omentum/duodenum/transverse colon were lysed using combined blunt dissection and selective electrocautery. The apex/dome of the gallbladder was grasped with an atraumatic grasper passed through the lateral port and retracted apically over the liver. The infundibulum was also grasped and retracted, exposing Calot's triangle. The peritoneum overlying the gallbladder infundibulum was incised and dissected free of surrounding peritoneal attachments, revealing the cystic duct and cystic artery, which were clipped twice on the patient side and once on the gallbladder specimen side close to the gallbladder. The gallbladder was then dissected from its peritoneal attachments to the liver using electrocautery, and the gallbladder was placed into a laparoscopic specimen bag and removed from the abdominal cavity via the epigastric port site. Hemostasis and secure placement of clips were confirmed, and intra-peritoneal cavity was inspected with no additional findings. PMI laparoscopic fascial closure device was then used to re-approximate fascia at the 10 mm epigastric port site.  All ports were then removed under direct visualization, and abdominal cavity was desuflated. All port sites were irrigated/cleaned, additional local anesthetic was injected at each incision, 3-0 Vicryl was used to re-approximate dermis at 10 mm port site(s), and subcuticular  4-0 Monocryl suture was used to re-approximate skin. Skin was then cleaned, dried, and sterile skin glue was applied. Patient was then safely able to be awakened, extubated, and transferred to PACU for post-operative monitoring and care.  I was present for all aspects of the above procedure, and no operative complications were apparent.

## 2017-10-05 NOTE — Transfer of Care (Signed)
Immediate Anesthesia Transfer of Care Note  Patient: Brianna Murphy  Procedure(s) Performed: LAPAROSCOPIC CHOLECYSTECTOMY (N/A )  Patient Location: PACU  Anesthesia Type:General  Level of Consciousness: drowsy  Airway & Oxygen Therapy: Patient Spontanous Breathing and Patient connected to face mask oxygen  Post-op Assessment: Report given to RN and Post -op Vital signs reviewed and stable  Post vital signs: Reviewed and stable  Last Vitals:  Vitals Value Taken Time  BP 145/100 10/05/2017 12:21 PM  Temp    Pulse 86 10/05/2017 12:22 PM  Resp 18 10/05/2017 12:22 PM  SpO2 100 % 10/05/2017 12:22 PM  Vitals shown include unvalidated device data.  Last Pain:  Vitals:   10/05/17 0842  TempSrc: Tympanic  PainSc: 0-No pain         Complications: No apparent anesthesia complications

## 2017-10-05 NOTE — Anesthesia Procedure Notes (Signed)
Procedure Name: Intubation Date/Time: 10/05/2017 10:05 AM Performed by: Dava NajjarFrazier, Kennedey Digilio, CRNA Pre-anesthesia Checklist: Patient identified, Emergency Drugs available, Suction available, Patient being monitored and Timeout performed Patient Re-evaluated:Patient Re-evaluated prior to induction Oxygen Delivery Method: Circle system utilized Preoxygenation: Pre-oxygenation with 100% oxygen Induction Type: IV induction Ventilation: Mask ventilation without difficulty Laryngoscope Size: Miller and 2 Grade View: Grade I Tube type: Oral Tube size: 7.0 mm Number of attempts: 1 Airway Equipment and Method: Stylet Placement Confirmation: ETT inserted through vocal cords under direct vision,  positive ETCO2 and breath sounds checked- equal and bilateral Secured at: 21 cm Tube secured with: Tape Dental Injury: Teeth and Oropharynx as per pre-operative assessment

## 2017-10-06 ENCOUNTER — Encounter: Payer: Self-pay | Admitting: Surgery

## 2017-10-06 LAB — SURGICAL PATHOLOGY

## 2017-10-08 DIAGNOSIS — K8020 Calculus of gallbladder without cholecystitis without obstruction: Secondary | ICD-10-CM

## 2017-10-09 ENCOUNTER — Encounter: Payer: Self-pay | Admitting: Surgery

## 2017-10-19 ENCOUNTER — Ambulatory Visit (INDEPENDENT_AMBULATORY_CARE_PROVIDER_SITE_OTHER): Payer: Self-pay | Admitting: Surgery

## 2017-10-19 ENCOUNTER — Encounter: Payer: Self-pay | Admitting: Surgery

## 2017-10-19 VITALS — BP 126/86 | HR 67 | Temp 98.2°F | Wt 250.0 lb

## 2017-10-19 DIAGNOSIS — Z09 Encounter for follow-up examination after completed treatment for conditions other than malignant neoplasm: Secondary | ICD-10-CM

## 2017-10-19 NOTE — Patient Instructions (Signed)

## 2017-10-19 NOTE — Progress Notes (Signed)
S/p lap chole by Dr. Earlene Plater Doing well Taking Po Some "soreness" No fevers or chills  PE NAD Abd: soft, nt, incisions healing well, no infection  A/P Doing well No infection or complications No heavy lifting RTW excuse given F/u prn

## 2019-12-11 ENCOUNTER — Other Ambulatory Visit: Payer: Self-pay

## 2019-12-11 ENCOUNTER — Encounter: Payer: Self-pay | Admitting: Family Medicine

## 2019-12-11 ENCOUNTER — Ambulatory Visit: Payer: Self-pay | Admitting: Family Medicine

## 2019-12-11 DIAGNOSIS — Z202 Contact with and (suspected) exposure to infections with a predominantly sexual mode of transmission: Secondary | ICD-10-CM

## 2019-12-11 DIAGNOSIS — B9689 Other specified bacterial agents as the cause of diseases classified elsewhere: Secondary | ICD-10-CM

## 2019-12-11 DIAGNOSIS — Z113 Encounter for screening for infections with a predominantly sexual mode of transmission: Secondary | ICD-10-CM

## 2019-12-11 DIAGNOSIS — N760 Acute vaginitis: Secondary | ICD-10-CM

## 2019-12-11 LAB — WET PREP FOR TRICH, YEAST, CLUE
Trichomonas Exam: NEGATIVE
Yeast Exam: NEGATIVE

## 2019-12-11 MED ORDER — METRONIDAZOLE 250 MG PO TABS: 250.0000 mg | ORAL_TABLET | Freq: Three times a day (TID) | ORAL | 0 refills | Status: AC

## 2019-12-11 MED ORDER — PENICILLIN G BENZATHINE 1200000 UNIT/2ML IM SUSP
2.4000 10*6.[IU] | Freq: Once | INTRAMUSCULAR | Status: AC
  Administered 2019-12-11: 2.4 10*6.[IU] via INTRAMUSCULAR

## 2019-12-11 NOTE — Progress Notes (Signed)
[facility] Department STI clinic/screening visit  Subjective:  Brianna Murphy is a 39 y.o. female being seen today for an STI screening visit. The patient reports they do not have symptoms.  Patient reports that they do not desire a pregnancy in the next year.   They reported they are not interested in discussing contraception today.  No LMP recorded (approximate).   Patient has the following medical conditions:   Patient Active Problem List   Diagnosis Date Noted   Calculus of gallbladder without cholecystitis without obstruction     No chief complaint on file.   HPI  Patient reports that her BF informed her that he was treated for syphilis about 3 weeks ago.  She states that she knew he was [facility] for [facility].  She states that he told her that "trhey didn't know what the lesion was so he was  Given a shot and some pills".  She states that they have been having unprotected sex for the past 3 weeks.  She is here for evaluation and  Treatment.  Client denies symptoms.  Last HIV test per patient/review of record was unknown Patient reports last pap was unknown.   See flowsheet for further details and programmatic requirements.    The following portions of the patient's history were reviewed and updated as appropriate: allergies, current medications, past medical history, past social history, past surgical history and problem list.  Objective:  There were no vitals filed for this visit.  Physical Exam Vitals and nursing note reviewed.  Constitutional:      Appearance: Normal appearance.  HENT:     Head: Normocephalic and atraumatic.     Mouth/Throat:     Mouth: Mucous membranes are moist.     Pharynx: Oropharynx is clear. No oropharyngeal exudate or posterior oropharyngeal erythema.  Pulmonary:     Effort: Pulmonary effort is normal.  Abdominal:     General: Abdomen is flat.     Palpations: There is no mass.     Tenderness: There is  no abdominal tenderness. There is no rebound.  Genitourinary:    General: Normal vulva.     Exam position: Lithotomy position.     Pubic Area: No rash or pubic lice.      Labia:        Right: No rash or lesion.        Left: No rash or lesion.      Vagina: Vaginal discharge present. No erythema, bleeding or lesions.     Cervix: No cervical motion tenderness, discharge, friability, lesion or erythema.     Uterus: Normal.      Adnexa: Right adnexa normal and left adnexa normal.     Rectum: Normal.     Comments: Large amount of thick, white disch adherring to walls. + strong fishy odor and pH >4.5 No lesions noted on exam. Lymphadenopathy:     Head:     Right side of head: No preauricular or posterior auricular adenopathy.     Left side of head: No preauricular or posterior auricular adenopathy.     Cervical: No cervical adenopathy.     Upper Body:     Right upper body: No supraclavicular or axillary adenopathy.     Left upper body: No supraclavicular or axillary adenopathy.     Lower Body: No right inguinal adenopathy. No left inguinal adenopathy.  Skin:    General: Skin is warm and dry.     Findings: No  lesion or rash.  Neurological:     Mental Status: She is alert and oriented to person, place, and time.      Assessment and Plan:  Brianna Murphy is a 39 y.o. female presenting to the [facility] Department for STI screening  1. Screening examination for venereal disease  - WET PREP FOR TRICH, YEAST, CLUE - Gonococcus culture - Chlamydia/Gonorrhea [facility] - HIV/HCV [facility] - Syphilis Serology, [facility]  2. Syphilis contact  - penicillin g benzathine (BICILLIN LA) 1200000 UNIT/2ML injection 2.4 Million Units  Co. Client on the SE of medication Co. Client no sexual activity x 1-2 weeks.  Labs results pending. Brianna Haggard, RN counseled client in clinic prior to treatment.  3. BV (bacterial vaginosis) Treat client for BV based onClinical f  indings on exam.  Unsure of pregnancy status- Metronidazole 250 mg TID  - metroNIDAZOLE (FLAGYL) 250 MG tablet; Take 1 tablet (250 mg total) by mouth 3 (three) times daily for 7 days.  Dispense: 21 tablet; Refill: 0     No follow-ups on file.  No future appointments.  Hassell Done, FNP

## 2019-12-11 NOTE — Progress Notes (Signed)
Wet mount reviewed with provider and pt received treatment for BV per Larene Pickett, FNP verbal order. Pt also treated as a contact to Syphilis per Larene Pickett, FNP order and verbal order with Bicillin 2.43mu IM. Pt tolerated well. Counseled pt per provider orders and pt states understanding. Counseled pt to stay for 20 minutes post treatment with Bicillin so we can monitor her for any adverse reactions. Pt stayed for 10 minutes without any issues and then reports that she has to leave. Pt left against medical advice. Provider orders completed.

## 2019-12-16 LAB — GONOCOCCUS CULTURE

## 2020-02-29 IMAGING — US US ABDOMEN LIMITED
1 series · 14 of 25 positions shown · non-contrast
Comparison: None.

CLINICAL DATA: 36-year-old female with intermittent abdominal pain.

EXAM:
ULTRASOUND ABDOMEN LIMITED RIGHT UPPER QUADRANT

[Series 1: us abdomen limited · 0.23mm/px · 14 of 43 slices shown]
[im 1/43]
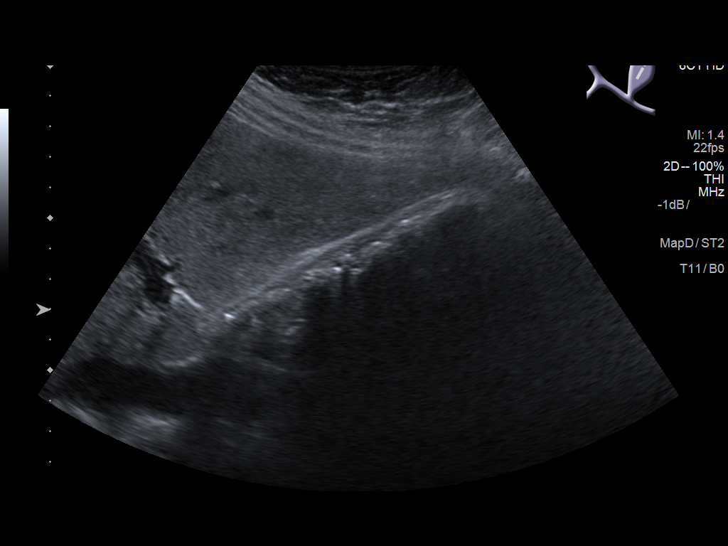
[im 4/43]
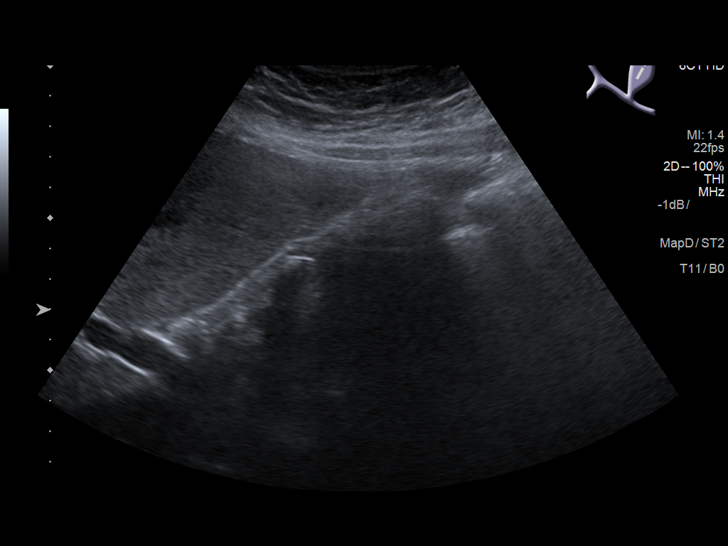
[im 8/43]
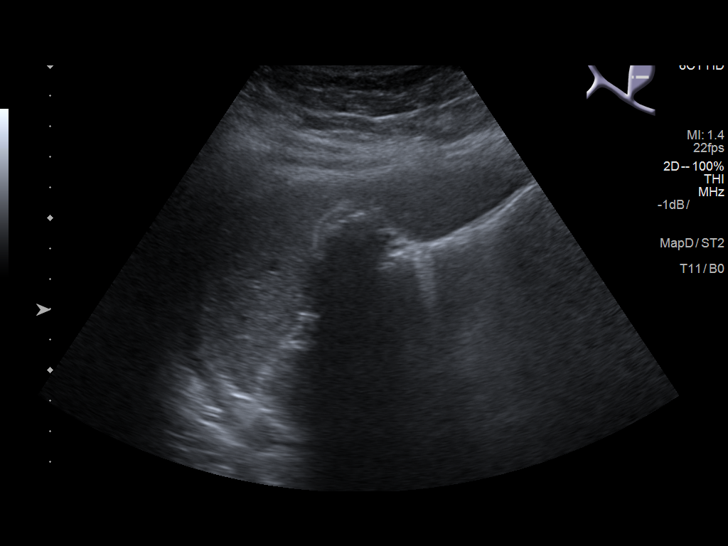
[im 11/43]
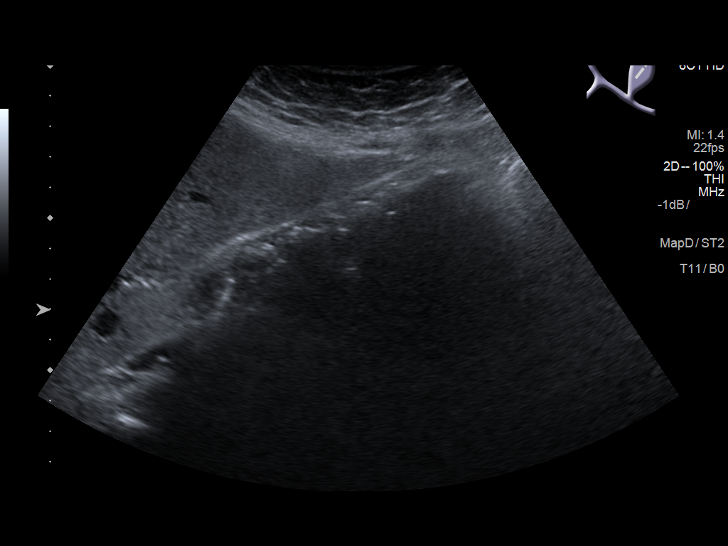
[im 15/43]
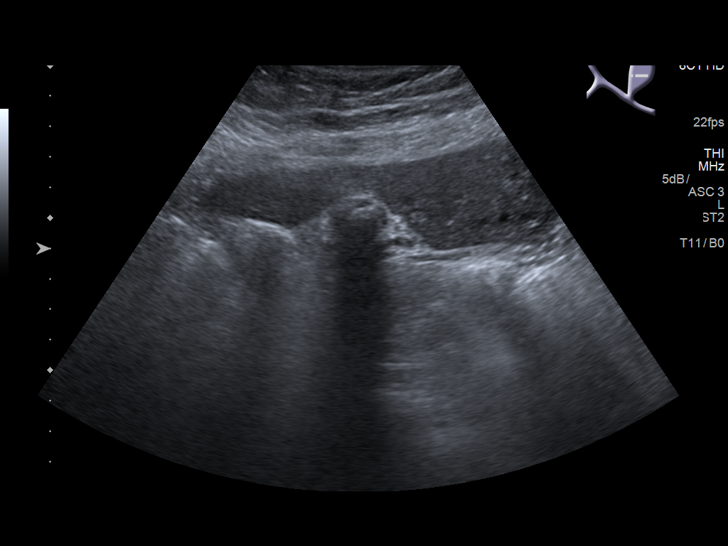
[im 16/43]
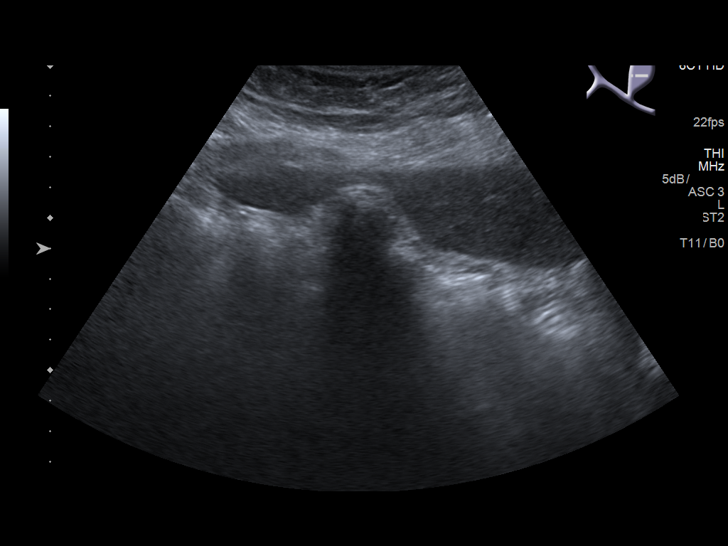
[im 20/43]
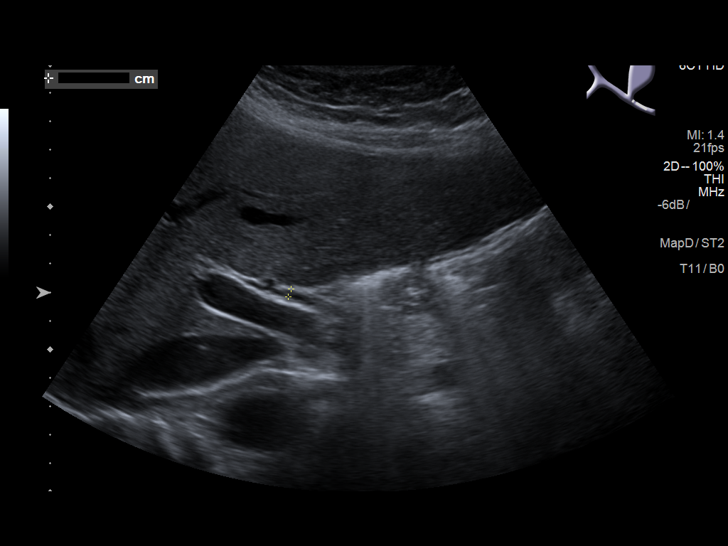
[im 23/43]
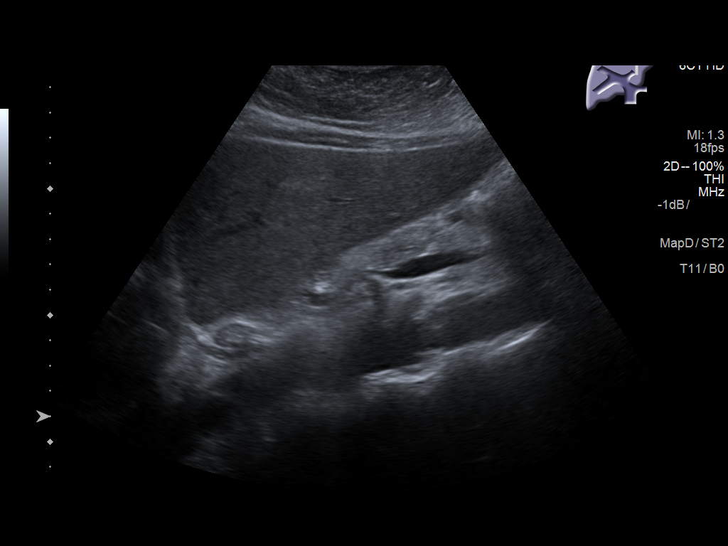
[im 27/43]
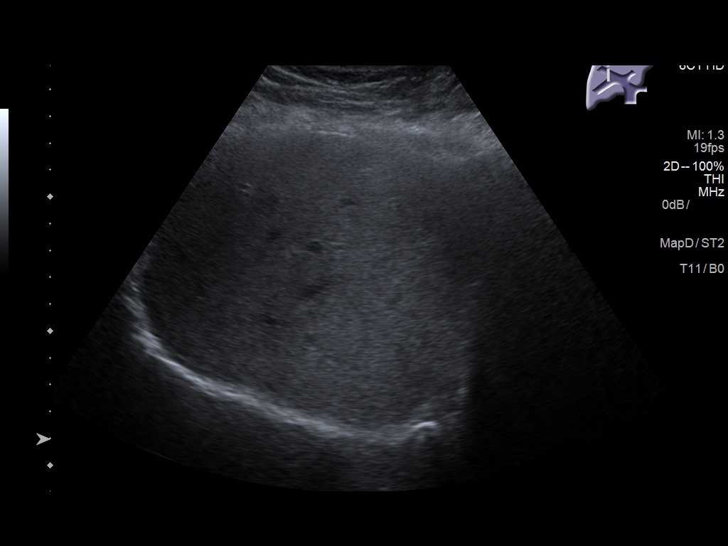
[im 29/43]
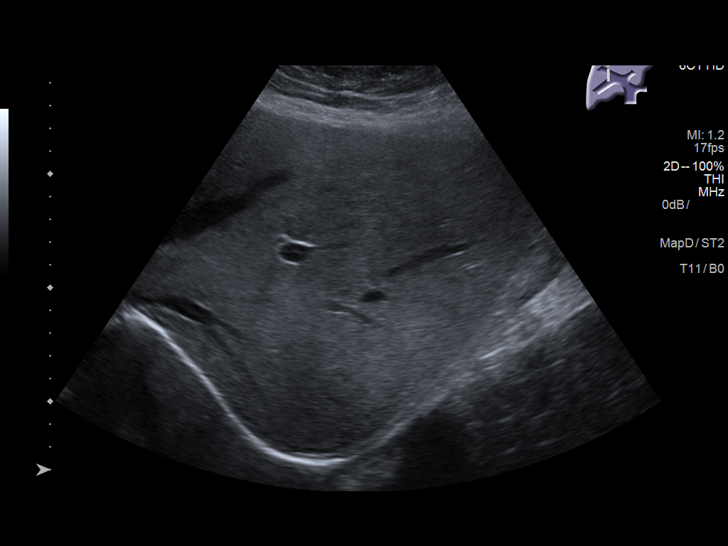
[im 32/43]
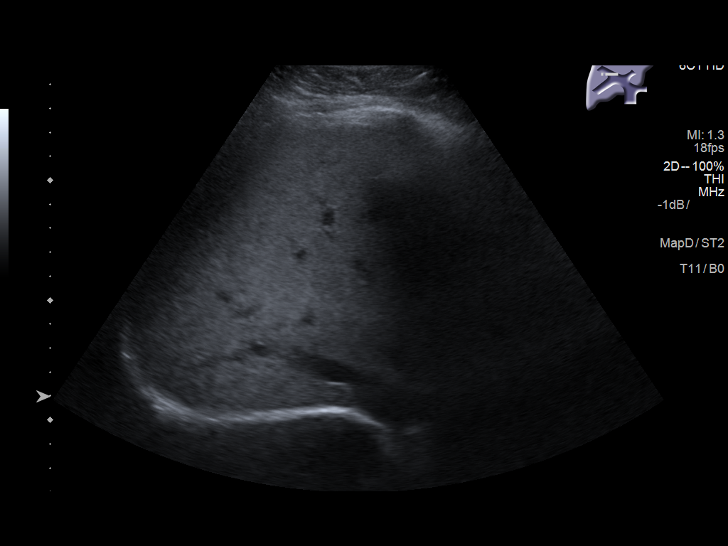
[im 36/43]
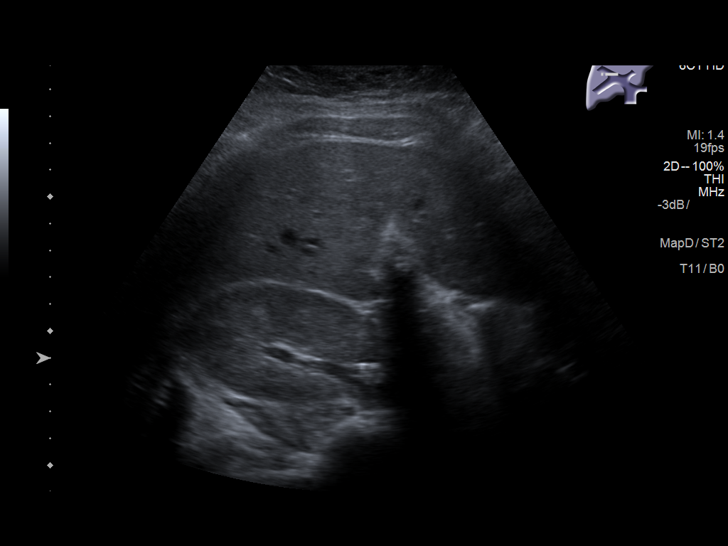
[im 39/43]
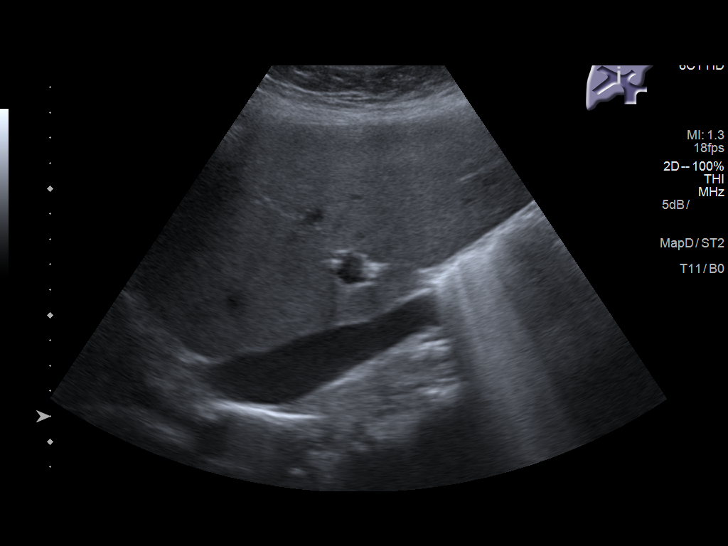
[im 43/43]
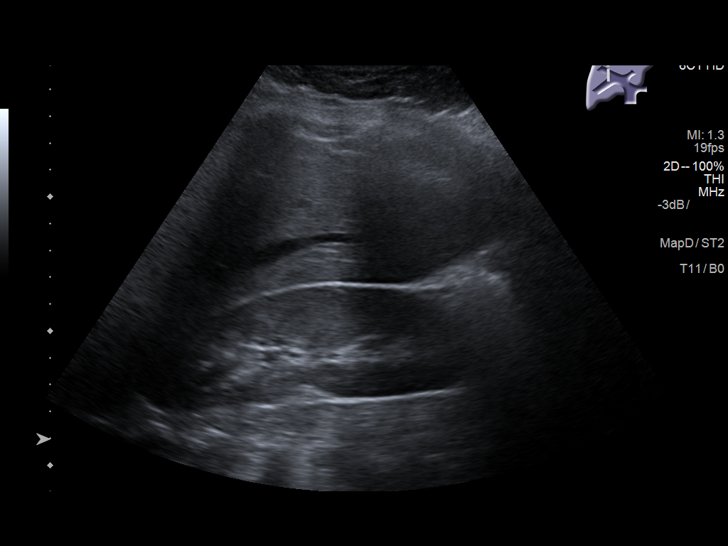

[14 of 25 positions shown; findings below may reference images not displayed]

FINDINGS: Gallbladder:

The gallbladder is filled with stones with a wall echo shadow
appearance. The gallbladder wall is slightly thickened measuring 5
mm. This may be partly related to underdistention. No definite
pericholecystic fluid. Negative sonographic Murphy's sign.

Common bile duct:

Diameter: 3 mm

Liver:

The liver is unremarkable as visualized. Portal vein is patent on
color Doppler imaging with normal direction of blood flow towards
the liver.
IMPRESSION: Cholelithiasis with mild thickened appearance of the gallbladder
wall but no other sonographic evidence of acute cholecystitis. A
hepatobiliary scintigraphy may provide better evaluation of the
gallbladder if there is a high clinical concern for acute
cholecystitis .

## 2021-09-08 ENCOUNTER — Encounter: Payer: Self-pay | Admitting: Emergency Medicine

## 2021-09-08 ENCOUNTER — Other Ambulatory Visit: Payer: Self-pay

## 2021-09-08 ENCOUNTER — Emergency Department
Admission: EM | Admit: 2021-09-08 | Discharge: 2021-09-08 | Disposition: A | Discharge status: Home / Self Care | Payer: Medicaid Other | Attending: Emergency Medicine | Admitting: Emergency Medicine

## 2021-09-08 DIAGNOSIS — K0889 Other specified disorders of teeth and supporting structures: Secondary | ICD-10-CM | POA: Diagnosis present

## 2021-09-08 DIAGNOSIS — K047 Periapical abscess without sinus: Secondary | ICD-10-CM | POA: Diagnosis not present

## 2021-09-08 MED ORDER — AMOXICILLIN-POT CLAVULANATE 875-125 MG PO TABS: 1.0000 | ORAL_TABLET | Freq: Three times a day (TID) | ORAL | 0 refills | Status: AC

## 2021-09-08 MED ORDER — OXYCODONE-ACETAMINOPHEN 5-325 MG PO TABS: 1.0000 | ORAL_TABLET | Freq: Four times a day (QID) | ORAL | 0 refills | Status: AC | PRN

## 2021-09-08 NOTE — ED Notes (Signed)
See triage note  presents with possible dental abscess  states she broke a tooth on right  woke up with swelling to right side of face ?

## 2021-09-08 NOTE — ED Provider Notes (Signed)
? ?[facility] ?Provider Note ? ?Patient Contact: 3:07 PM (approximate) ? ? ?History  ? ?Dental Pain ? ? ?HPI ? ?Kynsleigh Westendorf Breitenstein is a 41 y.o. female who presents the emergency department complaining of right upper dental pain and swelling.  Patient states that she has bad teeth in the right upper dentition, broke one of the teeth last night.  Patient has had pain, swelling to the right face.  No difficulty breathing or swallowing.  She states that she called her dentist but they are closed on Wednesdays and has scheduled an appointment to see them within the next week.  Patient presents for evaluation at this time.  She denies any URI symptoms, GI symptoms, chest pain, shortness of breath at this time. ?  ? ? ?Physical Exam  ? ?Triage Vital Signs: ?ED Triage Vitals  ?Enc Vitals Group  ?   BP 09/08/21 1414 (!) 188/103  ?   Pulse Rate 09/08/21 1414 70  ?   Resp 09/08/21 1414 20  ?   Temp 09/08/21 1414 (!) 101.1 ?F (38.4 ?C)  ?   Temp Source 09/08/21 1414 Axillary  ?   SpO2 09/08/21 1414 97 %  ?   Weight 09/08/21 1348 250 lb (113.4 kg)  ?   Height 09/08/21 1348 5\' 10"  (1.778 m)  ?   Head Circumference --   ?   Peak Flow --   ?   Pain Score 09/08/21 1348 6  ?   Pain Loc --   ?   Pain Edu? --   ?   Excl. in GC? --   ? ? ?Most recent vital signs: ?Vitals:  ? 09/08/21 1414  ?BP: (!) 188/103  ?Pulse: 70  ?Resp: 20  ?Temp: (!) 101.1 ?F (38.4 ?C)  ?SpO2: 97%  ? ? ? ?General: Alert and in no acute distress ?ENT: ?     Ears:  ?     Nose: No congestion/rhinnorhea. ?     Mouth/Throat: Mucous membranes are moist.  Poor dentition throughout.  Tooth in question is tooth #6 and this is eroded with mild edema of the gum.  There is no fluctuant lesion concerning for superficial abscess requiring incision and drainage.  Uvula is midline.  Visualization of the right cheek overlying this area reveals slight edema no gross erythema.  Palpation reveals tenderness over area of dentition but there is no palpable  abnormality or fluctuance. ?Neck: No stridor. No cervical spine tenderness to palpation. ?Hematological/Lymphatic/Immunilogical: No cervical lymphadenopathy. ?Cardiovascular:  Good peripheral perfusion ?Respiratory: Normal respiratory effort without tachypnea or retractions. Lungs CTAB.  ?Musculoskeletal: Full range of motion to all extremities.  ?Neurologic:  No gross focal neurologic deficits are appreciated.  ?Skin:   No rash noted ?Other: ? ? ?ED Results / Procedures / Treatments  ? ?Labs ?(all labs ordered are listed, but only abnormal results are displayed) ?Labs Reviewed - No data to display ? ? ?EKG ? ? ? ? ?RADIOLOGY ? ? ? ?No results found. ? ?PROCEDURES: ? ?Critical Care performed: No ? ?Procedures ? ? ?MEDICATIONS ORDERED IN ED: ?Medications - No data to display ? ? ?IMPRESSION / MDM / ASSESSMENT AND PLAN / ED COURSE  ?I reviewed the triage vital signs and the nursing notes. ?             ?               ? ?Differential diagnosis includes, but is not limited to, dental infection, gingivitis ? ? ?  Patient's diagnosis is consistent with dental infection.  Patient presents emergency department with dental pain, swelling of the right cheek.  Patient has findings concerning for dental infection with poor dentition in this area.  Patient states that her dentist is closed today but she is already scheduled an appointment for within the next week.  Patient has mild edema to the area but no concern at this time for deep space infection.  No indication for labs or imaging currently.  Patient will have antibiotics for improvement of infection.  Return precautions discussed with the patient.  Follow-up with dentist.. . Patient is given ED precautions to return to the ED for any worsening or new symptoms. ? ? ? ?  ? ? ?FINAL CLINICAL IMPRESSION(S) / ED DIAGNOSES  ? ?Final diagnoses:  ?Dental infection  ? ? ? ?Rx / DC Orders  ? ?ED Discharge Orders   ? ?      Ordered  ?  amoxicillin-clavulanate (AUGMENTIN) 875-125 MG  tablet  3 times daily       ? 09/08/21 1532  ?  oxyCODONE-acetaminophen (PERCOCET/ROXICET) 5-325 MG tablet  Every 6 hours PRN       ? 09/08/21 1532  ? ?  ?  ? ?  ? ? ? ?Note:  This document was prepared using Dragon voice recognition software and may include unintentional dictation errors. ?  ?Racheal Patches, PA-C ?09/08/21 1532 ? ?  ?Sharman Cheek, MD ?09/09/21 0031 ? ?

## 2021-09-08 NOTE — ED Triage Notes (Signed)
Pt comes into the ED via POV c/o right upper dental pain that is now causing swelling in her cheek.  Pt in NAD at this time.  Pt states she attempted to call her dentist, but they are closed.   ?
